# Patient Record
Sex: Female | Born: 1945
Health system: Southern US, Community
[De-identification: ages and names within clinical notes are randomized; demographics above are authoritative.]

## PROBLEM LIST (undated history)

## (undated) DIAGNOSIS — N1831 Chronic kidney disease, stage 3a: Secondary | ICD-10-CM

## (undated) DIAGNOSIS — T8859XA Other complications of anesthesia, initial encounter: Secondary | ICD-10-CM

## (undated) DIAGNOSIS — M545 Low back pain: Secondary | ICD-10-CM

## (undated) DIAGNOSIS — Z972 Presence of dental prosthetic device (complete) (partial): Secondary | ICD-10-CM

## (undated) DIAGNOSIS — R0600 Dyspnea, unspecified: Secondary | ICD-10-CM

## (undated) DIAGNOSIS — F03B0 Unspecified dementia, moderate, without behavioral disturbance, psychotic disturbance, mood disturbance, and anxiety: Secondary | ICD-10-CM

## (undated) DIAGNOSIS — E7800 Pure hypercholesterolemia, unspecified: Secondary | ICD-10-CM

## (undated) DIAGNOSIS — F17200 Nicotine dependence, unspecified, uncomplicated: Secondary | ICD-10-CM

## (undated) DIAGNOSIS — J449 Chronic obstructive pulmonary disease, unspecified: Secondary | ICD-10-CM

## (undated) DIAGNOSIS — E079 Disorder of thyroid, unspecified: Secondary | ICD-10-CM

## (undated) DIAGNOSIS — M1990 Unspecified osteoarthritis, unspecified site: Secondary | ICD-10-CM

## (undated) DIAGNOSIS — G2581 Restless legs syndrome: Secondary | ICD-10-CM

## (undated) HISTORY — DX: Nicotine dependence, unspecified, uncomplicated: F17.200

## (undated) HISTORY — DX: Low back pain: M54.5

## (undated) HISTORY — DX: Disorder of thyroid, unspecified: E07.9

## (undated) HISTORY — DX: Chronic obstructive pulmonary disease, unspecified: J44.9

## (undated) HISTORY — DX: Pure hypercholesterolemia, unspecified: E78.00

## (undated) HISTORY — PX: APPENDECTOMY: SHX54

---
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
## 1979-01-09 HISTORY — PX: OTHER SURGICAL HISTORY: SHX169

## 2004-02-21 ENCOUNTER — Emergency Department: Payer: Self-pay | Admitting: Unknown Physician Specialty

## 2004-02-21 ENCOUNTER — Other Ambulatory Visit: Payer: Self-pay

## 2004-10-04 ENCOUNTER — Emergency Department: Payer: Self-pay | Admitting: Emergency Medicine

## 2005-10-15 DIAGNOSIS — E079 Disorder of thyroid, unspecified: Secondary | ICD-10-CM

## 2005-10-15 DIAGNOSIS — M1710 Unilateral primary osteoarthritis, unspecified knee: Secondary | ICD-10-CM | POA: Insufficient documentation

## 2005-10-15 HISTORY — DX: Disorder of thyroid, unspecified: E07.9

## 2005-10-24 ENCOUNTER — Ambulatory Visit: Payer: Self-pay | Admitting: General Practice

## 2005-10-30 ENCOUNTER — Ambulatory Visit: Payer: Self-pay | Admitting: General Practice

## 2006-05-06 DIAGNOSIS — M545 Low back pain, unspecified: Secondary | ICD-10-CM

## 2006-05-06 HISTORY — DX: Low back pain, unspecified: M54.50

## 2006-10-30 ENCOUNTER — Ambulatory Visit: Payer: Self-pay | Admitting: General Practice

## 2007-11-05 ENCOUNTER — Ambulatory Visit: Payer: Self-pay | Admitting: General Practice

## 2008-02-27 ENCOUNTER — Ambulatory Visit: Payer: Self-pay | Admitting: Family Medicine

## 2009-01-27 ENCOUNTER — Ambulatory Visit: Payer: Self-pay | Admitting: Family Medicine

## 2009-02-24 DIAGNOSIS — J449 Chronic obstructive pulmonary disease, unspecified: Secondary | ICD-10-CM | POA: Insufficient documentation

## 2009-10-06 ENCOUNTER — Emergency Department: Payer: Self-pay | Admitting: Emergency Medicine

## 2009-10-11 ENCOUNTER — Ambulatory Visit: Payer: Self-pay | Admitting: Family Medicine

## 2012-10-08 HISTORY — PX: CATARACT EXTRACTION: SUR2

## 2014-01-28 DIAGNOSIS — F17200 Nicotine dependence, unspecified, uncomplicated: Secondary | ICD-10-CM | POA: Diagnosis not present

## 2014-01-28 DIAGNOSIS — M1990 Unspecified osteoarthritis, unspecified site: Secondary | ICD-10-CM | POA: Diagnosis not present

## 2014-01-28 DIAGNOSIS — J40 Bronchitis, not specified as acute or chronic: Secondary | ICD-10-CM | POA: Diagnosis not present

## 2014-02-24 ENCOUNTER — Other Ambulatory Visit (HOSPITAL_COMMUNITY): Payer: Self-pay | Admitting: Internal Medicine

## 2014-02-24 DIAGNOSIS — M810 Age-related osteoporosis without current pathological fracture: Secondary | ICD-10-CM

## 2014-02-24 DIAGNOSIS — J40 Bronchitis, not specified as acute or chronic: Secondary | ICD-10-CM | POA: Diagnosis not present

## 2014-02-24 DIAGNOSIS — M1990 Unspecified osteoarthritis, unspecified site: Secondary | ICD-10-CM | POA: Diagnosis not present

## 2014-02-24 DIAGNOSIS — Z0001 Encounter for general adult medical examination with abnormal findings: Secondary | ICD-10-CM | POA: Diagnosis not present

## 2014-02-24 DIAGNOSIS — R7309 Other abnormal glucose: Secondary | ICD-10-CM | POA: Diagnosis not present

## 2014-02-24 DIAGNOSIS — Z23 Encounter for immunization: Secondary | ICD-10-CM | POA: Diagnosis not present

## 2014-02-24 DIAGNOSIS — R42 Dizziness and giddiness: Secondary | ICD-10-CM | POA: Diagnosis not present

## 2014-02-24 DIAGNOSIS — R739 Hyperglycemia, unspecified: Secondary | ICD-10-CM | POA: Diagnosis not present

## 2014-03-01 ENCOUNTER — Ambulatory Visit (HOSPITAL_COMMUNITY)
Admission: RE | Admit: 2014-03-01 | Discharge: 2014-03-01 | Disposition: A | Discharge status: Home / Self Care | Payer: Medicare Other | Source: Ambulatory Visit | Attending: Internal Medicine | Admitting: Internal Medicine

## 2014-03-01 ENCOUNTER — Other Ambulatory Visit (HOSPITAL_COMMUNITY): Payer: Self-pay

## 2014-03-01 DIAGNOSIS — Z13820 Encounter for screening for osteoporosis: Secondary | ICD-10-CM | POA: Diagnosis not present

## 2014-03-01 DIAGNOSIS — M810 Age-related osteoporosis without current pathological fracture: Secondary | ICD-10-CM | POA: Diagnosis not present

## 2014-03-01 DIAGNOSIS — Z780 Asymptomatic menopausal state: Secondary | ICD-10-CM | POA: Insufficient documentation

## 2014-04-13 DIAGNOSIS — J40 Bronchitis, not specified as acute or chronic: Secondary | ICD-10-CM | POA: Diagnosis not present

## 2014-10-25 ENCOUNTER — Telehealth: Payer: Self-pay | Admitting: Family Medicine

## 2014-10-25 NOTE — Telephone Encounter (Addendum)
Called pt and pt states she does not need to come in right now/MW

## 2014-10-25 NOTE — Telephone Encounter (Signed)
Please advise 

## 2014-10-25 NOTE — Telephone Encounter (Signed)
Pt calling wanting to know if Simona Huh will take her back as a pt, she was last seen April 2015 here, pt has been seeing Dr. Emilia Beck in [place] but would like to come back here if possible. CC

## 2014-12-10 ENCOUNTER — Encounter: Payer: Self-pay | Admitting: Family Medicine

## 2014-12-10 ENCOUNTER — Ambulatory Visit (INDEPENDENT_AMBULATORY_CARE_PROVIDER_SITE_OTHER): Payer: Medicare Other | Admitting: Family Medicine

## 2014-12-10 VITALS — BP 124/70 | HR 73 | Temp 97.9°F | Resp 16 | Ht 64.0 in | Wt 139.0 lb

## 2014-12-10 DIAGNOSIS — J069 Acute upper respiratory infection, unspecified: Secondary | ICD-10-CM | POA: Diagnosis not present

## 2014-12-10 DIAGNOSIS — E7800 Pure hypercholesterolemia, unspecified: Secondary | ICD-10-CM | POA: Insufficient documentation

## 2014-12-10 MED ORDER — DOXYCYCLINE HYCLATE 100 MG PO CAPS: 100.0000 mg | ORAL_CAPSULE | Freq: Two times a day (BID) | ORAL | Status: DC

## 2014-12-10 NOTE — Progress Notes (Signed)
Subjective:     Patient ID: Katie Carter, female   DOB: 09-Jul-1945, 69 y.o.   MRN: [id number]  HPI  Chief Complaint  Patient presents with  . Cough    Patient comes in office today with concerns of cough and congestion for 1 week. Patient denies any cold like symptoms, she has taken otc Robitussin and Alka-seltzer  Reports greenish sputum but no shortness of breath. States her symptom were cold like at onset but sinus congestion and sore throat have resolved. States she has an albuterol inhaler she uses once a day. Current tobacco use is 0.5 ppd. Reports recent death of a daughter and her spouse is recovering from pneumonia. Accompanied by a daughter today.   Review of Systems  Constitutional: Negative for fever and chills.       Objective:   Physical Exam  Constitutional: She appears well-developed and well-nourished. No distress.  Ears: T.M's intact without inflammation Throat: no tonsillar enlargement or exudate Neck: no cervical adenopathy Lungs: clear, diminished breath sounds symmetrically     Assessment:    1. Upper respiratory infection - doxycycline (VIBRAMYCIN) 100 MG capsule; Take 1 capsule (100 mg total) by mouth 2 (two) times daily.  Dispense: 20 capsule; Refill: 0    Plan:    Discussed scheduling albuterol and use of expectorants while ill. If increased shortness of breath or sputum becoming thicker/purulent to start doxycycline.

## 2014-12-10 NOTE — Patient Instructions (Signed)
Continue expectorants like Robitussin or Mucinex. Increase Albuterol to twice daily while ill or more as needed. If your sputum does not clear up in the next few days or you feel more short of breath start the antibiotic.

## 2015-02-16 ENCOUNTER — Ambulatory Visit (INDEPENDENT_AMBULATORY_CARE_PROVIDER_SITE_OTHER): Payer: Medicare Other | Admitting: Physician Assistant

## 2015-02-16 ENCOUNTER — Encounter: Payer: Self-pay | Admitting: Physician Assistant

## 2015-02-16 VITALS — BP 124/72 | HR 72 | Temp 98.2°F | Resp 16 | Ht 64.0 in | Wt 138.0 lb

## 2015-02-16 DIAGNOSIS — Z0000 Encounter for general adult medical examination without abnormal findings: Secondary | ICD-10-CM

## 2015-02-16 NOTE — Patient Instructions (Signed)

## 2015-02-16 NOTE — Progress Notes (Signed)
Patient ID: Katie Carter, female   DOB: [birth year], 70 y.o.   MRN: [id number]        Patient: Katie Carter, Female    DOB: 26-Aug-1945, 70 y.o.   MRN: [id number] Visit Date: 02/16/2015  Today's Provider: Mar Daring, PA-C   Chief Complaint  Patient presents with  . Annual wellness exam   Subjective:    Annual wellness visit Katie Carter is a 70 y.o. female. She feels well. She reports exercising occasionally. She reports she is sleeping well. She states she is trying to improve her eating habits and adheres to a healthy diet. She is incorporating more fruits and vegetables. She does still smoke and does not wish to quit. She does not wish to have a Pap smear, colonoscopy, mammogram or labs at this time. There is a positive family history of breast cancer in her sister. She does states she performs self breast exams regularly. She states if she were to find something she would get a mammogram at that time. There is no family history of colon cancer. She is status post hysterectomy but states that ovaries remain.  Review of Systems  Constitutional: Negative.   HENT: Negative.   Eyes: Negative.   Respiratory: Negative.   Cardiovascular: Negative.   Gastrointestinal: Negative.   Endocrine: Negative.   Genitourinary: Negative.   Musculoskeletal: Negative.   Skin: Negative.   Allergic/Immunologic: Negative.   Neurological: Negative.   Hematological: Negative.   Psychiatric/Behavioral: Negative.     Social History   Social History  . Marital Status: Divorced    Spouse Name: N/A  . Number of Children: N/A  . Years of Education: N/A   Occupational History  . Not on file.   Social History Main Topics  . Smoking status: Current Every Day Smoker -- 0.50 packs/day    Types: Cigarettes  . Smokeless tobacco: Never Used  . Alcohol Use: 1.2 - 2.4 oz/week    2-4 Standard drinks or equivalent per week     Comment: Occasional  . Drug Use: No  . Sexual Activity: Not on file     Other Topics Concern  . Not on file   Social History Narrative    Past Medical History  Diagnosis Date  . Tobacco use disorder   . Chronic airway obstruction ([place])   . Lumbago 05/06/2006  . Thyroid disease 10/15/2005  . Hypercholesteremia      Patient Active Problem List   Diagnosis Date Noted  . Hypercholesteremia 12/10/2014  . CAFL (chronic airflow limitation) ([place]) 02/24/2009  . LBP (low back pain) 05/06/2006  . Acid reflux 10/15/2005  . Adult hypothyroidism 10/15/2005  . Primary localized osteoarthrosis, lower leg 10/15/2005  . Tobacco use 10/15/2005    Past Surgical History  Procedure Laterality Date  . Cataract extraction Right 10/2012  . Surgical fixation  1981    of the left knee fracture  . Abdominal hysterectomy  1960    abdominal Hysterectomy with bilateral salpingo-oophorectomy  . Appendectomy      Her family history is not on file.    Previous Medications   ALBUTEROL (PROVENTIL HFA;VENTOLIN HFA) 108 (90 BASE) MCG/ACT INHALER    Inhale into the lungs every 6 (six) hours as needed for wheezing or shortness of breath.   DOXYCYCLINE (VIBRAMYCIN) 100 MG CAPSULE    Take 1 capsule (100 mg total) by mouth 2 (two) times daily.   RANITIDINE (ZANTAC) 150 MG TABLET    Take 150 mg by mouth 2 (  two) times daily.    Patient Care Team: Margo Common, PA as PCP - General (Family Medicine)     Objective:   Vitals: BP 124/72 mmHg  Pulse 72  Temp(Src) 98.2 F (36.8 C)  Resp 16  Ht 5\' 4"  (1.626 m)  Wt 138 lb (62.596 kg)  BMI 23.68 kg/m2  Physical Exam  Constitutional: She is oriented to person, place, and time. She appears well-developed and well-nourished. No distress.  HENT:  Head: Normocephalic and atraumatic.  Right Ear: Hearing, tympanic membrane, external ear and ear canal normal.  Left Ear: Hearing, tympanic membrane, external ear and ear canal normal.  Nose: Nose normal.  Mouth/Throat: Uvula is midline, oropharynx is clear and moist and mucous  membranes are normal. No oropharyngeal exudate, posterior oropharyngeal edema or posterior oropharyngeal erythema.  Eyes: Conjunctivae and EOM are normal. Pupils are equal, round, and reactive to light. Right eye exhibits no discharge. Left eye exhibits no discharge. No scleral icterus.  Neck: Normal range of motion. Neck supple. No JVD present. No tracheal deviation present. No thyromegaly present.  Cardiovascular: Normal rate, regular rhythm, normal heart sounds and intact distal pulses.  Exam reveals no gallop and no friction rub.   No murmur heard. Pulmonary/Chest: Effort normal and breath sounds normal. No respiratory distress. She has no wheezes. She has no rales. She exhibits no tenderness. Right breast exhibits no inverted nipple, no mass, no nipple discharge, no skin change and no tenderness. Left breast exhibits no inverted nipple, no mass, no nipple discharge, no skin change and no tenderness. Breasts are symmetrical.  Abdominal: Soft. Bowel sounds are normal. She exhibits no distension and no mass. There is no tenderness. There is no rebound and no guarding.  Musculoskeletal: Normal range of motion. She exhibits no edema or tenderness.  Lymphadenopathy:    She has no cervical adenopathy.  Neurological: She is alert and oriented to person, place, and time.  Skin: Skin is warm and dry. No rash noted. She is not diaphoretic.  Psychiatric: She has a normal mood and affect. Her behavior is normal. Judgment and thought content normal.  Vitals reviewed.   Activities of Daily Living In your present state of health, do you have any difficulty performing the following activities: 02/16/2015  Hearing? N  Vision? N  Difficulty concentrating or making decisions? N  Walking or climbing stairs? N  Dressing or bathing? N  Doing errands, shopping? N    Fall Risk Assessment Fall Risk  02/16/2015  Falls in the past year? No     Depression Screen PHQ 2/9 Scores 02/16/2015  PHQ - 2 Score 0     Cognitive Testing - 6-CIT  Correct? Score   What year is it? yes 0 0 or 4  What month is it? yes 0 0 or 3  Memorize:    Katie Carter,  [birth year],  [place],      What time is it? (within 1 hour) yes 0 0 or 3  Count backwards from 20 yes 0 0, 2, or 4  Name the months of the year no 3 0, 2, or 4  Repeat name & address above no 4 0, 2, 4, 6, 8, or 10       TOTAL SCORE  7/28   Interpretation:  Normal  Normal (0-7) Abnormal (8-28)       Assessment & Plan:     Annual Wellness Visit  Reviewed patient's Family Medical History Reviewed and updated list of patient's  medical providers Assessment of cognitive impairment was done Assessed patient's functional ability Established a written schedule for health screening [place] Completed and Reviewed  Exercise Activities and Dietary recommendations Goals    None       There is no immunization history on file for this patient.  Health Maintenance  Topic Date Due  . Hepatitis C Screening  1945/07/31  . TETANUS/TDAP  01/25/1962  . MAMMOGRAM  01/25/1993  . COLONOSCOPY  01/25/1993  . ZOSTAVAX  01/26/2003  . PNA vac Low Risk Adult (1 of 2 - PCV13) 01/26/2008  . INFLUENZA VACCINE  08/09/2014  . DEXA SCAN  Completed      Discussed health benefits of physical activity, and encouraged her to engage in regular exercise appropriate for her age and condition.    ------------------------------------------------------------------------------------------------------------

## 2015-04-01 ENCOUNTER — Other Ambulatory Visit: Payer: Self-pay | Admitting: Family Medicine

## 2015-04-01 DIAGNOSIS — Z1231 Encounter for screening mammogram for malignant neoplasm of breast: Secondary | ICD-10-CM

## 2015-04-12 ENCOUNTER — Ambulatory Visit: Payer: Medicare Other | Attending: Family Medicine

## 2015-09-14 ENCOUNTER — Ambulatory Visit: Payer: Self-pay | Admitting: Physician Assistant

## 2015-10-12 DIAGNOSIS — Z23 Encounter for immunization: Secondary | ICD-10-CM | POA: Diagnosis not present

## 2015-11-16 ENCOUNTER — Ambulatory Visit: Payer: Self-pay

## 2015-11-21 DIAGNOSIS — Z23 Encounter for immunization: Secondary | ICD-10-CM | POA: Diagnosis not present

## 2016-02-23 ENCOUNTER — Ambulatory Visit (INDEPENDENT_AMBULATORY_CARE_PROVIDER_SITE_OTHER): Payer: Medicare Other | Admitting: Physician Assistant

## 2016-02-23 ENCOUNTER — Encounter: Payer: Self-pay | Admitting: Physician Assistant

## 2016-02-23 VITALS — BP 130/70 | HR 86 | Temp 97.7°F | Resp 16 | Ht 64.0 in | Wt 138.4 lb

## 2016-02-23 DIAGNOSIS — Z720 Tobacco use: Secondary | ICD-10-CM | POA: Diagnosis not present

## 2016-02-23 DIAGNOSIS — Z1159 Encounter for screening for other viral diseases: Secondary | ICD-10-CM | POA: Diagnosis not present

## 2016-02-23 DIAGNOSIS — Z1211 Encounter for screening for malignant neoplasm of colon: Secondary | ICD-10-CM | POA: Diagnosis not present

## 2016-02-23 DIAGNOSIS — Z1231 Encounter for screening mammogram for malignant neoplasm of breast: Secondary | ICD-10-CM | POA: Diagnosis not present

## 2016-02-23 DIAGNOSIS — E7800 Pure hypercholesterolemia, unspecified: Secondary | ICD-10-CM | POA: Diagnosis not present

## 2016-02-23 DIAGNOSIS — Z0000 Encounter for general adult medical examination without abnormal findings: Secondary | ICD-10-CM | POA: Diagnosis not present

## 2016-02-23 DIAGNOSIS — Z1239 Encounter for other screening for malignant neoplasm of breast: Secondary | ICD-10-CM

## 2016-02-23 NOTE — Patient Instructions (Signed)

## 2016-02-23 NOTE — Progress Notes (Signed)
Patient: Katie Carter, Female    DOB: January 13, 1945, 71 y.o.   MRN: [id number] Visit Date: 02/23/2016  Today's Provider: Mar Daring, PA-C   Chief Complaint  Patient presents with  . Medicare Wellness   Subjective:    Annual wellness visit Katie Carter is a 71 y.o. female. She feels well. She reports exercising none. She reports she is sleeping well. Per patient she had her pneumonia and influenza vaccine 3 months ago at [facility]. Patient's older brother passed away two days ago from cancer. Patient reports she has had mammograms and colonoscopies in the past. Cannot remember the dates. They have always been normal.  -----------------------------------------------------------   Review of Systems  Constitutional: Positive for diaphoresis.  HENT: Positive for rhinorrhea and sneezing.   Eyes: Negative.   Respiratory: Positive for cough.   Cardiovascular: Negative.   Gastrointestinal: Negative.   Endocrine: Negative.   Genitourinary: Negative.   Musculoskeletal: Negative.   Skin: Negative.   Allergic/Immunologic: Negative.   Neurological: Negative.   Hematological: Negative.   Psychiatric/Behavioral: Negative.     Social History   Social History  . Marital status: Divorced    Spouse name: N/A  . Number of children: N/A  . Years of education: N/A   Occupational History  . Not on file.   Social History Main Topics  . Smoking status: Current Every Day Smoker    Packs/day: 0.50    Types: Cigarettes  . Smokeless tobacco: Never Used  . Alcohol use 1.2 - 2.4 oz/week    2 - 4 Standard drinks or equivalent per week     Comment: Occasional  . Drug use: No  . Sexual activity: Not on file   Other Topics Concern  . Not on file   Social History Narrative  . No narrative on file    Past Medical History:  Diagnosis Date  . Chronic airway obstruction ([place])   . Hypercholesteremia   . Lumbago 05/06/2006  . Thyroid disease 10/15/2005  . Tobacco use  disorder      Patient Active Problem List   Diagnosis Date Noted  . Hypercholesteremia 12/10/2014  . CAFL (chronic airflow limitation) ([place]) 02/24/2009  . LBP (low back pain) 05/06/2006  . Acid reflux 10/15/2005  . Adult hypothyroidism 10/15/2005  . Primary localized osteoarthrosis, lower leg 10/15/2005  . Tobacco use 10/15/2005    Past Surgical History:  Procedure Laterality Date  . ABDOMINAL HYSTERECTOMY  1960   abdominal Hysterectomy with bilateral salpingo-oophorectomy  . APPENDECTOMY    . CATARACT EXTRACTION Right 10/2012  . Surgical Fixation  1981   of the left knee fracture    Her family history is not on file.      Current Outpatient Prescriptions:  .  albuterol (PROVENTIL HFA;VENTOLIN HFA) 108 (90 BASE) MCG/ACT inhaler, Inhale into the lungs every 6 (six) hours as needed for wheezing or shortness of breath., Disp: , Rfl:  .  ranitidine (ZANTAC) 150 MG tablet, Take 150 mg by mouth 2 (two) times daily., Disp: , Rfl:   Patient Care Team: Margo Common, PA as PCP - General (Family Medicine)     Objective:   Vitals: BP 130/70 (BP Location: Right Arm, Patient Position: Sitting, Cuff Size: Normal)   Pulse 86   Temp 97.7 F (36.5 C) (Oral)   Resp 16   Ht 5\' 4"  (1.626 m)   Wt 138 lb 6.4 oz (62.8 kg)   BMI 23.76 kg/m   Physical  Exam  Constitutional: She is oriented to person, place, and time. She appears well-developed and well-nourished. No distress.  HENT:  Head: Normocephalic and atraumatic.  Right Ear: Hearing, tympanic membrane, external ear and ear canal normal.  Left Ear: Hearing, tympanic membrane, external ear and ear canal normal.  Nose: Nose normal.  Mouth/Throat: Uvula is midline, oropharynx is clear and moist and mucous membranes are normal. She has dentures. No oropharyngeal exudate.  Eyes: Conjunctivae and EOM are normal. Pupils are equal, round, and reactive to light. Right eye exhibits no discharge. Left eye exhibits no discharge. No scleral  icterus.  Neck: Normal range of motion. Neck supple. No JVD present. Carotid bruit is not present. No tracheal deviation present. No thyromegaly present.  Cardiovascular: Normal rate, regular rhythm, normal heart sounds and intact distal pulses.  Exam reveals no gallop and no friction rub.   No murmur heard. Pulmonary/Chest: Effort normal and breath sounds normal. No respiratory distress. She has no wheezes. She has no rales. She exhibits no tenderness.  Abdominal: Soft. Bowel sounds are normal. She exhibits no distension and no mass. There is no tenderness. There is no rebound and no guarding.  Musculoskeletal: Normal range of motion. She exhibits no edema or tenderness.  Lymphadenopathy:    She has no cervical adenopathy.  Neurological: She is alert and oriented to person, place, and time.  Skin: Skin is warm and dry. No rash noted. She is not diaphoretic.  Psychiatric: She has a normal mood and affect. Her behavior is normal. Judgment and thought content normal.  Vitals reviewed.   Activities of Daily Living In your present state of health, do you have any difficulty performing the following activities: 02/23/2016  Hearing? N  Vision? N  Difficulty concentrating or making decisions? N  Walking or climbing stairs? N  Dressing or bathing? N  Doing errands, shopping? N  Some recent data might be hidden    Fall Risk Assessment Fall Risk  02/23/2016 02/16/2015  Falls in the past year? No No     Depression Screen PHQ 2/9 Scores 02/23/2016 02/16/2015  PHQ - 2 Score 0 0    Cognitive Testing - 6-CIT  Correct? Score   What year is it? yes 0 0 or 4  What month is it? yes 0 0 or 3  Memorize:    [name],  40,  [place] [address]  [place],      What time is it? (within 1 hour) yes 0 0 or 3  Count backwards from 20 yes 0 0, 2, or 4  Name the months of the year yes 0 0, 2, or 4  Repeat name & address above yes 0 0, 2, 4, 6, 8, or 10       TOTAL SCORE  0/28   Interpretation:  Normal  Normal  (0-7) Abnormal (8-28)   Audit-C Alcohol Use Screening  Question Answer Points  How often do you have alcoholic drink? never 0  On days you do drink alcohol, how many drinks do you typically consume? 0 0  How oftey will you drink 6 or more in a total? never 0  Total Score:  0   A score of 3 or more in women, and 4 or more in men indicates increased risk for alcohol abuse, EXCEPT if all of the points are from question 1.      Assessment & Plan:     Annual Wellness Visit  Reviewed patient's Family Medical History Reviewed and updated list of  patient's medical providers Assessment of cognitive impairment was done Assessed patient's functional ability Established a written schedule for health screening [place] Completed and Reviewed  Exercise Activities and Dietary recommendations Goals    None       There is no immunization history on file for this patient.  Health Maintenance  Topic Date Due  . Hepatitis C Screening  [birth year]  . TETANUS/TDAP  01/25/1962  . MAMMOGRAM  01/25/1993  . COLONOSCOPY  01/25/1993  . ZOSTAVAX  01/26/2003  . PNA vac Low Risk Adult (1 of 2 - PCV13) 01/26/2008  . INFLUENZA VACCINE  08/09/2015  . DEXA SCAN  Completed     Discussed health benefits of physical activity, and encouraged her to engage in regular exercise appropriate for her age and condition.   1. Medicare annual wellness visit, subsequent Normal exam today.   2. Hypercholesteremia H/O this. Patient has refused cholesterol lowering medications. Will check labs as below and f/u pending results. - CBC w/Diff/Platelet - Comprehensive Metabolic Panel (CMET) - Lipid Profile  3. Breast cancer screening Patient refuses mammogram this year. States she does her own breast exams. No family history of breast cancer.   4. Colon cancer screening Patient refuses. Patient was also offered cologuard but she refused as well.   5. Tobacco use Compulsive smoker.  Will check labs as below and f/u pending results. - CBC w/Diff/Platelet - Comprehensive Metabolic Panel (CMET)  6. Need for hepatitis C screening test - Hepatitis C Antibody  ------------------------------------------------------------------------------------------------------------    Mar Daring, PA-C  [facility]

## 2016-02-24 ENCOUNTER — Telehealth: Payer: Self-pay

## 2016-02-24 LAB — COMPREHENSIVE METABOLIC PANEL
A/G RATIO: 1.6 (ref 1.2–2.2)
ALK PHOS: 72 IU/L (ref 39–117)
ALT: 12 IU/L (ref 0–32)
AST: 15 IU/L (ref 0–40)
Albumin: 4.5 g/dL (ref 3.5–4.8)
BILIRUBIN TOTAL: 0.3 mg/dL (ref 0.0–1.2)
BUN / CREAT RATIO: 15 (ref 12–28)
BUN: 14 mg/dL (ref 8–27)
CHLORIDE: 99 mmol/L (ref 96–106)
CO2: 24 mmol/L (ref 18–29)
Calcium: 10 mg/dL (ref 8.7–10.3)
Creatinine, Ser: 0.91 mg/dL (ref 0.57–1.00)
GFR calc Af Amer: 72 mL/min/{1.73_m2} (ref >59)
GFR calc non Af Amer: 63 mL/min/{1.73_m2} (ref >59)
GLUCOSE: 72 mg/dL (ref 65–99)
Globulin, Total: 2.9 g/dL (ref 1.5–4.5)
POTASSIUM: 4.4 mmol/L (ref 3.5–5.2)
Sodium: 139 mmol/L (ref 134–144)
Total Protein: 7.4 g/dL (ref 6.0–8.5)

## 2016-02-24 LAB — CBC WITH DIFFERENTIAL/PLATELET
BASOS ABS: 0.1 10*3/uL (ref 0.0–0.2)
Basos: 1 %
EOS (ABSOLUTE): 0.2 10*3/uL (ref 0.0–0.4)
Eos: 2 %
Hematocrit: 47.5 % — ABNORMAL HIGH (ref 34.0–46.6)
Hemoglobin: 16.3 g/dL — ABNORMAL HIGH (ref 11.1–15.9)
Immature Grans (Abs): 0 10*3/uL (ref 0.0–0.1)
Immature Granulocytes: 0 %
LYMPHS ABS: 4.2 10*3/uL — AB (ref 0.7–3.1)
Lymphs: 30 %
MCH: 30.1 pg (ref 26.6–33.0)
MCHC: 34.3 g/dL (ref 31.5–35.7)
MCV: 88 fL (ref 79–97)
MONOS ABS: 1.2 10*3/uL — AB (ref 0.1–0.9)
Monocytes: 8 %
Neutrophils Absolute: 8.4 10*3/uL — ABNORMAL HIGH (ref 1.4–7.0)
Neutrophils: 59 %
PLATELETS: 260 10*3/uL (ref 150–379)
RBC: 5.41 x10E6/uL — ABNORMAL HIGH (ref 3.77–5.28)
RDW: 14.3 % (ref 12.3–15.4)
WBC: 14.2 10*3/uL — ABNORMAL HIGH (ref 3.4–10.8)

## 2016-02-24 LAB — LIPID PANEL
CHOLESTEROL TOTAL: 227 mg/dL — AB (ref 100–199)
Chol/HDL Ratio: 5 ratio units — ABNORMAL HIGH (ref 0.0–4.4)
HDL: 45 mg/dL (ref >39)
LDL Calculated: 138 mg/dL — ABNORMAL HIGH (ref 0–99)
Triglycerides: 220 mg/dL — ABNORMAL HIGH (ref 0–149)
VLDL CHOLESTEROL CAL: 44 mg/dL — AB (ref 5–40)

## 2016-02-24 NOTE — Telephone Encounter (Signed)
Pt advised-aa 

## 2016-02-24 NOTE — Telephone Encounter (Signed)
-----   Message from Mar Daring, [place] sent at 02/24/2016 12:41 PM EST ----- Slight elevated WBC and elevated HgB that could be secondary to smoking. Would recommend a recheck to see if any changes in 4-6 weeks. Cholesterol is slightly up as well. Work on trying to eat healthier, limiting fatty foods, sugars and red meats. All other labs were normal.

## 2016-02-24 NOTE — Telephone Encounter (Signed)
[id number] 

## 2016-08-15 DIAGNOSIS — Z1231 Encounter for screening mammogram for malignant neoplasm of breast: Secondary | ICD-10-CM | POA: Diagnosis not present

## 2016-08-15 LAB — HM MAMMOGRAPHY

## 2016-08-23 ENCOUNTER — Encounter: Payer: Self-pay | Admitting: Family Medicine

## 2016-10-03 LAB — FECAL OCCULT BLOOD, GUAIAC: Fecal Occult Blood: NEGATIVE

## 2017-01-24 ENCOUNTER — Ambulatory Visit: Payer: Self-pay | Admitting: Family Medicine

## 2017-01-24 ENCOUNTER — Encounter: Payer: Self-pay | Admitting: Physician Assistant

## 2017-01-24 ENCOUNTER — Ambulatory Visit (INDEPENDENT_AMBULATORY_CARE_PROVIDER_SITE_OTHER): Payer: Medicare Other | Admitting: Physician Assistant

## 2017-01-24 VITALS — BP 130/68 | HR 63 | Temp 97.8°F | Resp 16 | Wt 136.0 lb

## 2017-01-24 DIAGNOSIS — R0981 Nasal congestion: Secondary | ICD-10-CM | POA: Diagnosis not present

## 2017-01-24 MED ORDER — FEXOFENADINE HCL 180 MG PO TABS: 180.0000 mg | ORAL_TABLET | Freq: Every day | ORAL | 0 refills | Status: DC

## 2017-01-24 MED ORDER — FLUTICASONE PROPIONATE 50 MCG/ACT NA SUSP: 2.0000 | Freq: Every day | NASAL | 6 refills | Status: DC

## 2017-01-24 NOTE — Progress Notes (Signed)
Patient: Katie Carter Female    DOB: [birth year]   72 y.o.   MRN: [id number] Visit Date: 01/24/2017  Today's Provider: Trinna Post, PA-C   Chief Complaint  Patient presents with  . Sinusitis  . URI   Subjective:    Katie Carter is a 72 y/o woman with 50 pack year smoking history presenting today with four months of nasal/sinus congestion and nasal drainage. She presents here today with her daughter who is concerned for a sinus infection that may turn into pneumonia.  She says she has had 4 months of intermittent nasal congestion and rhinorrhea. She does have a cough that is minimally productive. No fevers, chills, N/V, confusion. Denies wheezing and SOB.   Sinusitis  This is a recurrent problem. The current episode started more than 1 month ago. The problem has been waxing and waning since onset. There has been no fever. Associated symptoms include coughing, headaches and a sore throat. Pertinent negatives include no congestion, ear pain, neck pain, shortness of breath, sinus pressure, sneezing or swollen glands.  URI   This is a recurrent problem. The current episode started more than 1 month ago. The problem has been waxing and waning. There has been no fever. Associated symptoms include coughing, headaches, rhinorrhea, sinus pain and a sore throat. Pertinent negatives include no abdominal pain, chest pain, congestion, dysuria, ear pain, nausea, neck pain, plugged ear sensation, rash, sneezing, swollen glands, vomiting or wheezing. She has tried inhaler use for the symptoms. The treatment provided moderate relief.       Allergies  Allergen Reactions  . Tramadol Rash    no further information given.     Current Outpatient Medications:  .  albuterol (PROVENTIL HFA;VENTOLIN HFA) 108 (90 BASE) MCG/ACT inhaler, Inhale into the lungs every 6 (six) hours as needed for wheezing or shortness of breath., Disp: , Rfl:  .  ranitidine (ZANTAC) 150 MG tablet, Take 150 mg by mouth  2 (two) times daily., Disp: , Rfl:   Review of Systems  Constitutional: Negative.   HENT: Positive for postnasal drip, rhinorrhea, sinus pain and sore throat. Negative for congestion, ear discharge, ear pain, nosebleeds, sinus pressure, sneezing, trouble swallowing and voice change.   Eyes: Positive for discharge. Negative for photophobia, pain, redness, itching and visual disturbance.  Respiratory: Positive for cough. Negative for apnea, choking, chest tightness, shortness of breath, wheezing and stridor.   Cardiovascular: Negative for chest pain.  Gastrointestinal: Negative.  Negative for abdominal pain, nausea and vomiting.  Genitourinary: Negative for dysuria.  Musculoskeletal: Negative for neck pain.  Skin: Negative for rash.  Neurological: Positive for headaches. Negative for dizziness and light-headedness.    Social History   Tobacco Use  . Smoking status: Current Every Day Smoker    Packs/day: 0.50    Types: Cigarettes  . Smokeless tobacco: Never Used  Substance Use Topics  . Alcohol use: Yes    Alcohol/week: 1.2 - 2.4 oz    Types: 2 - 4 Standard drinks or equivalent per week    Comment: Occasional   Objective:   There were no vitals taken for this visit. There were no vitals filed for this visit.   Physical Exam  Constitutional: She is oriented to person, place, and time. She appears well-developed and well-nourished.  HENT:  Right Ear: External ear normal.  Left Ear: External ear normal.  Mouth/Throat: Oropharynx is clear and moist. No oropharyngeal exudate.  Eyes: Conjunctivae are normal.  Neck:  Neck supple.  Cardiovascular: Normal rate and regular rhythm.  Pulmonary/Chest: Effort normal. She has wheezes.  Some mild expiratory wheezing.   Lymphadenopathy:    She has no cervical adenopathy.  Neurological: She is alert and oriented to person, place, and time.  Skin: Skin is warm and dry.  Psychiatric: She has a normal mood and affect. Her behavior is normal.          Assessment & Plan:     1. Nasal congestion  Symptoms consistent with allergies vs infection or pneumonia. Patient does have significant smoking history and I suspect she has COPD, though I don't see spirometry in the chart, though her symptoms are not consistent with COPD exacerbation. Advised to quit smoking.  - fexofenadine (ALLEGRA ALLERGY) 180 MG tablet; Take 1 tablet (180 mg total) by mouth daily.  Dispense: 90 tablet; Refill: 0 - fluticasone (FLONASE) 50 MCG/ACT nasal spray; Place 2 sprays into both nostrils daily.  Dispense: 16 g; Refill: 6  2. Sinus congestion  - fexofenadine (ALLEGRA ALLERGY) 180 MG tablet; Take 1 tablet (180 mg total) by mouth daily.  Dispense: 90 tablet; Refill: 0  No Follow-up on file.  The entirety of the information documented in the History of Present Illness, Review of Systems and Physical Exam were personally obtained by me. Portions of this information were initially documented by Ashley Royalty, CMA and reviewed by me for thoroughness and accuracy.         Trinna Post, PA-C  [facility]

## 2017-01-24 NOTE — Patient Instructions (Addendum)
Allegra And flonase   Nasal Allergies Nasal allergies are a reaction to allergens in the air. Allergens are tiny specks (particles) in the air that cause your body to have an allergic reaction. Nasal allergies are not passed from person to person (contagious). They cannot be cured, but they can be controlled. Common causes of nasal allergies include:  Pollen from grasses, trees, and weeds.  House dust mites.  Pet dander.  Mold.  Follow these instructions at home:  Avoid the allergen that is causing your symptoms, if you can.  Keep windows closed. If possible, use air conditioning when there is a lot of pollen in the air.  Do not use fans in your home.  Do not hang clothes outside to dry.  Wear sunglasses to keep pollen out of your eyes.  Wash your hands right away after you touch household pets.  Take over-the-counter and prescription medicines only as told by your doctor.  Keep all follow-up visits as told by your doctor. This is important. Contact a doctor if:  You have a fever.  You have a cough that does not go away (is persistent).  You start to make whistling sounds when you breathe (wheeze).  Your symptoms do not get better with treatment.  You have thick fluid coming from your nose.  You start to have nosebleeds. Get help right away if:  Your tongue or your lips are swollen.  You have trouble breathing.  You feel light-headed or you feel like you are going to pass out (faint).  You have cold sweats. This information is not intended to replace advice given to you by your health care provider. Make sure you discuss any questions you have with your health care provider. Document Released: 04/26/2010 Document Revised: 06/02/2015 Document Reviewed: 07/07/2014 Elsevier Interactive Patient Education  [employer].

## 2017-03-11 ENCOUNTER — Encounter: Payer: Self-pay | Admitting: Family Medicine

## 2017-03-12 ENCOUNTER — Ambulatory Visit (INDEPENDENT_AMBULATORY_CARE_PROVIDER_SITE_OTHER): Payer: Medicare Other | Admitting: Family Medicine

## 2017-03-12 ENCOUNTER — Encounter: Payer: Self-pay | Admitting: Family Medicine

## 2017-03-12 ENCOUNTER — Ambulatory Visit (INDEPENDENT_AMBULATORY_CARE_PROVIDER_SITE_OTHER): Payer: Medicare Other

## 2017-03-12 VITALS — BP 132/72 | HR 76 | Temp 98.2°F | Ht 64.0 in | Wt 139.0 lb

## 2017-03-12 VITALS — BP 132/72 | HR 72 | Temp 98.2°F | Ht 64.0 in | Wt 139.0 lb

## 2017-03-12 DIAGNOSIS — Z1159 Encounter for screening for other viral diseases: Secondary | ICD-10-CM

## 2017-03-12 DIAGNOSIS — Z720 Tobacco use: Secondary | ICD-10-CM | POA: Diagnosis not present

## 2017-03-12 DIAGNOSIS — Z0000 Encounter for general adult medical examination without abnormal findings: Secondary | ICD-10-CM | POA: Diagnosis not present

## 2017-03-12 DIAGNOSIS — E7800 Pure hypercholesterolemia, unspecified: Secondary | ICD-10-CM | POA: Diagnosis not present

## 2017-03-12 NOTE — Patient Instructions (Signed)
Katie Carter , Thank you for taking time to come for your Medicare Wellness Visit. I appreciate your ongoing commitment to your health goals. Please review the following plan we discussed and let me know if I can assist you in the future.   Screening recommendations/referrals: Colonoscopy: FOBT up to date Mammogram: Up to date Bone Density: Up to date Recommended yearly ophthalmology/optometry visit for glaucoma screening and checkup Recommended yearly dental visit for hygiene and checkup  Vaccinations: Influenza vaccine: Up to date Pneumococcal vaccine: Up to date Tdap vaccine: Pt declines today.  Shingles vaccine: Pt declines today.     Advanced directives: Advance directive discussed with you today. Even though you declined this today please call our office should you change your mind and we can give you the proper paperwork for you to fill out.  Conditions/risks identified: Recommend to start exercising 3 days a week for at least minutes a time. Pt to start exercising when the weather warms up.   Next appointment: 2:40 PM today with [name].   Preventive Care 74 Years and Older, Female Preventive care refers to lifestyle choices and visits with your health care provider that can promote health and wellness. What does preventive care include?  A yearly physical exam. This is also called an annual well check.  Dental exams once or twice a year.  Routine eye exams. Ask your health care provider how often you should have your eyes checked.  Personal lifestyle choices, including:  Daily care of your teeth and gums.  Regular physical activity.  Eating a healthy diet.  Avoiding tobacco and drug use.  Limiting alcohol use.  Practicing safe sex.  Taking low-dose aspirin every day.  Taking vitamin and mineral supplements as recommended by your health care provider. What happens during an annual well check? The services and screenings done by your health care  provider during your annual well check will depend on your age, overall health, lifestyle risk factors, and family history of disease. Counseling  Your health care provider may ask you questions about your:  Alcohol use.  Tobacco use.  Drug use.  Emotional well-being.  Home and relationship well-being.  Sexual activity.  Eating habits.  History of falls.  Memory and ability to understand (cognition).  Work and work Statistician.  Reproductive health. Screening  You may have the following tests or measurements:  Height, weight, and BMI.  Blood pressure.  Lipid and cholesterol levels. These may be checked every 5 years, or more frequently if you are over 42 years old.  Skin check.  Lung cancer screening. You may have this screening every year starting at age 74 if you have a 30-pack-year history of smoking and currently smoke or have quit within the past 15 years.  Fecal occult blood test (FOBT) of the stool. You may have this test every year starting at age 74.  Flexible sigmoidoscopy or colonoscopy. You may have a sigmoidoscopy every 5 years or a colonoscopy every 10 years starting at age 74.  Hepatitis C blood test.  Hepatitis B blood test.  Sexually transmitted disease (STD) testing.  Diabetes screening. This is done by checking your blood sugar (glucose) after you have not eaten for a while (fasting). You may have this done every 1-3 years.  Bone density scan. This is done to screen for osteoporosis. You may have this done starting at age 74.  Mammogram. This may be done every 1-2 years. Talk to your health care provider about how often you should  have regular mammograms. Talk with your health care provider about your test results, treatment options, and if necessary, the need for more tests. Vaccines  Your health care provider may recommend certain vaccines, such as:  Influenza vaccine. This is recommended every year.  Tetanus, diphtheria, and acellular  pertussis (Tdap, Td) vaccine. You may need a Td booster every 10 years.  Zoster vaccine. You may need this after age 74.  Pneumococcal 13-valent conjugate (PCV13) vaccine. One dose is recommended after age 74.  Pneumococcal polysaccharide (PPSV23) vaccine. One dose is recommended after age 74. Talk to your health care provider about which screenings and vaccines you need and how often you need them. This information is not intended to replace advice given to you by your health care provider. Make sure you discuss any questions you have with your health care provider. Document Released: 01/21/2015 Document Revised: 09/14/2015 Document Reviewed: 10/26/2014 Elsevier Interactive Patient Education  2017 [place] Prevention in the Home Falls can cause injuries. They can happen to people of all ages. There are many things you can do to make your home safe and to help prevent falls. What can I do on the outside of my home?  Regularly fix the edges of walkways and driveways and fix any cracks.  Remove anything that might make you trip as you walk through a door, such as a raised step or threshold.  Trim any bushes or trees on the path to your home.  Use bright outdoor lighting.  Clear any walking paths of anything that might make someone trip, such as rocks or tools.  Regularly check to see if handrails are loose or broken. Make sure that both sides of any steps have handrails.  Any raised decks and porches should have guardrails on the edges.  Have any leaves, snow, or ice cleared regularly.  Use sand or salt on walking paths during winter.  Clean up any spills in your garage right away. This includes oil or grease spills. What can I do in the bathroom?  Use night lights.  Install grab bars by the toilet and in the tub and shower. Do not use towel bars as grab bars.  Use non-skid mats or decals in the tub or shower.  If you need to sit down in the shower, use a plastic,  non-slip stool.  Keep the floor dry. Clean up any water that spills on the floor as soon as it happens.  Remove soap buildup in the tub or shower regularly.  Attach bath mats securely with double-sided non-slip rug tape.  Do not have throw rugs and other things on the floor that can make you trip. What can I do in the bedroom?  Use night lights.  Make sure that you have a light by your bed that is easy to reach.  Do not use any sheets or blankets that are too big for your bed. They should not hang down onto the floor.  Have a firm chair that has side arms. You can use this for support while you get dressed.  Do not have throw rugs and other things on the floor that can make you trip. What can I do in the kitchen?  Clean up any spills right away.  Avoid walking on wet floors.  Keep items that you use a lot in easy-to-reach places.  If you need to reach something above you, use a strong step stool that has a grab bar.  Keep electrical cords out of  the way.  Do not use floor polish or wax that makes floors slippery. If you must use wax, use non-skid floor wax.  Do not have throw rugs and other things on the floor that can make you trip. What can I do with my stairs?  Do not leave any items on the stairs.  Make sure that there are handrails on both sides of the stairs and use them. Fix handrails that are broken or loose. Make sure that handrails are as long as the stairways.  Check any carpeting to make sure that it is firmly attached to the stairs. Fix any carpet that is loose or worn.  Avoid having throw rugs at the top or bottom of the stairs. If you do have throw rugs, attach them to the floor with carpet tape.  Make sure that you have a light switch at the top of the stairs and the bottom of the stairs. If you do not have them, ask someone to add them for you. What else can I do to help prevent falls?  Wear shoes that:  Do not have high heels.  Have rubber  bottoms.  Are comfortable and fit you well.  Are closed at the toe. Do not wear sandals.  If you use a stepladder:  Make sure that it is fully opened. Do not climb a closed stepladder.  Make sure that both sides of the stepladder are locked into place.  Ask someone to hold it for you, if possible.  Clearly mark and make sure that you can see:  Any grab bars or handrails.  First and last steps.  Where the edge of each step is.  Use tools that help you move around (mobility aids) if they are needed. These include:  Canes.  Walkers.  Scooters.  Crutches.  Turn on the lights when you go into a dark area. Replace any light bulbs as soon as they burn out.  Set up your furniture so you have a clear path. Avoid moving your furniture around.  If any of your floors are uneven, fix them.  If there are any pets around you, be aware of where they are.  Review your medicines with your doctor. Some medicines can make you feel dizzy. This can increase your chance of falling. Ask your doctor what other things that you can do to help prevent falls. This information is not intended to replace advice given to you by your health care provider. Make sure you discuss any questions you have with your health care provider. Document Released: 10/21/2008 Document Revised: 06/02/2015 Document Reviewed: 01/29/2014 Elsevier Interactive Patient Education  2017 [employer].

## 2017-03-12 NOTE — Progress Notes (Addendum)
Subjective:   Katie Carter is a 72 y.o. female who presents for Medicare Annual (Subsequent) preventive examination.  Review of Systems:  N/A  Cardiac Risk Factors include: advanced age (>66men, >18 women);smoking/ tobacco exposure;dyslipidemia     Objective:     Vitals: BP 132/72 (BP Location: Left Arm)   Pulse 76   Temp 98.2 F (36.8 C) (Oral)   Ht 5\' 4"  (1.626 m)   Wt 139 lb (63 kg)   BMI 23.86 kg/m   Body mass index is 23.86 kg/m.  Advanced Directives 03/12/2017 02/23/2016  Does Patient Have a Medical Advance Directive? No No  Would patient like information on creating a medical advance directive? No - Patient declined -    Tobacco Social History   Tobacco Use  Smoking Status Current Every Day Smoker  . Packs/day: 0.50  . Types: Cigarettes  Smokeless Tobacco Never Used     Ready to quit: No Counseling given: No   Clinical Intake:  Pre-visit preparation completed: Yes  Pain : No/denies pain Pain Score: 0-No pain     Nutritional Status: BMI of 19-24  Normal Nutritional Risks: None Diabetes: No  How often do you need to have someone help you when you read instructions, pamphlets, or other written materials from your doctor or pharmacy?: 1 - Never  Interpreter Needed?: No  Information entered by :: [facility], LPN  Past Medical History:  Diagnosis Date  . Chronic airway obstruction ([place])   . Hypercholesteremia   . Lumbago 05/06/2006  . Thyroid disease 10/15/2005  . Tobacco use disorder    Past Surgical History:  Procedure Laterality Date  . ABDOMINAL HYSTERECTOMY  1960   abdominal Hysterectomy with bilateral salpingo-oophorectomy  . APPENDECTOMY    . CATARACT EXTRACTION Right 10/2012  . Surgical Fixation  1981   of the left knee fracture   Family History  Problem Relation Age of Onset  . Alzheimer's disease Mother   . Cancer Brother    Social History   Socioeconomic History  . Marital status: Divorced    Spouse name: None  .  Number of children: 2  . Years of education: None  . Highest education level: 9th grade  Social Needs  . Financial resource strain: Not hard at all  . Food insecurity - worry: Never true  . Food insecurity - inability: Never true  . Transportation needs - medical: No  . Transportation needs - non-medical: No  Occupational History  . Occupation: retired  Tobacco Use  . Smoking status: Current Every Day Smoker    Packs/day: 0.50    Types: Cigarettes  . Smokeless tobacco: Never Used  Substance and Sexual Activity  . Alcohol use: Yes    Comment: Occasionally a couple beers/monthly  . Drug use: No  . Sexual activity: None  Other Topics Concern  . None  Social History Narrative   1 daughter living and has 1 deceased.    Outpatient Encounter Medications as of 03/12/2017  Medication Sig  . calcium-vitamin D (OSCAL WITH D) 500-200 MG-UNIT tablet Take 1 tablet by mouth daily with breakfast.  . Omega-3 Fatty Acids (FISH OIL) 1000 MG CAPS Take by mouth daily.  Marland Kitchen albuterol (PROVENTIL HFA;VENTOLIN HFA) 108 (90 BASE) MCG/ACT inhaler Inhale into the lungs every 6 (six) hours as needed for wheezing or shortness of breath.  . fexofenadine (ALLEGRA ALLERGY) 180 MG tablet Take 1 tablet (180 mg total) by mouth daily. (Patient not taking: Reported on 03/12/2017)  . fluticasone (FLONASE) 50  MCG/ACT nasal spray Place 2 sprays into both nostrils daily. (Patient not taking: Reported on 03/12/2017)  . ranitidine (ZANTAC) 150 MG tablet Take 150 mg by mouth 2 (two) times daily.   No facility-administered encounter medications on file as of 03/12/2017.     Activities of Daily Living In your present state of health, do you have any difficulty performing the following activities: 03/12/2017  Hearing? N  Vision? N  Difficulty concentrating or making decisions? N  Walking or climbing stairs? N  Dressing or bathing? N  Doing errands, shopping? N  Preparing Food and eating ? N  Using the Toilet? N  In the past  six months, have you accidently leaked urine? N  Do you have problems with loss of bowel control? N  Managing your Medications? N  Managing your Finances? N  Housekeeping or managing your Housekeeping? N  Some recent data might be hidden    Patient Care Team: Chrismon, Vickki Muff, PA as PCP - General (Family Medicine)    Assessment:   This is a routine wellness examination for Katie Carter.  Exercise Activities and Dietary recommendations Current Exercise Habits: The patient does not participate in regular exercise at present, Exercise limited by: None identified  Goals    . Exercise 3x per week (30 min per time)     Recommend to start exercising 3 days a week for at least minutes a time. Pt to start exercising when the weather warms up.        Fall Risk Fall Risk  03/12/2017 02/23/2016 02/16/2015  Falls in the past year? No No No   Is the patient's home free of loose throw rugs in walkways, pet beds, electrical cords, etc?   yes      Grab bars in the bathroom? no      Handrails on the stairs?   yes      Adequate lighting?   yes  Timed Get Up and Go performed: N/A  Depression Screen PHQ 2/9 Scores 03/12/2017 03/12/2017 02/23/2016 02/16/2015  PHQ - 2 Score 0 0 0 0  PHQ- 9 Score 0 - - -     Cognitive Function: Pt declined screening today.         Immunization History  Administered Date(s) Administered  . Influenza, High Dose Seasonal PF 10/02/2016  . Pneumococcal Conjugate-13 10/02/2016    Qualifies for Shingles Vaccine? Due for Shingles vaccine. Declined my offer to administer today. Education has been provided regarding the importance of this vaccine. Pt has been advised to call her insurance company to determine her out of pocket expense. Advised she may also receive this vaccine at her local pharmacy or Health Dept. Verbalized acceptance and understanding.  Screening Tests Health Maintenance  Topic Date Due  . Hepatitis C Screening  1945-03-22  . TETANUS/TDAP  01/25/1962  .  COLONOSCOPY  10/03/2017 (Originally 01/25/1993)  . PNA vac Low Risk Adult (2 of 2 - PPSV23) 10/02/2017  . COLON CANCER SCREENING ANNUAL FOBT  10/03/2017  . MAMMOGRAM  08/16/2018  . INFLUENZA VACCINE  Completed  . DEXA SCAN  Completed    Cancer Screenings: Lung: Low Dose CT Chest recommended if Age 87-80 years, 30 pack-year currently smoking OR have quit w/in 15years. Patient does qualify but declines order.  Breast:  Up to date on Mammogram? Yes   Up to date of Bone Density/Dexa? Yes Colorectal: FOBT up to date  Additional Screenings:  Hepatitis C Screening: Ordered today.     Plan:  I have  personally reviewed and addressed the Medicare Annual Wellness questionnaire and have noted the following in the patient's chart:  A. Medical and social history B. Use of alcohol, tobacco or illicit drugs  C. Current medications and supplements D. Functional ability and status E.  Nutritional status F.  Physical activity G. Advance directives H. List of other physicians I.  Hospitalizations, surgeries, and ER visits in previous 12 months J.  [place] such as hearing and vision if needed, cognitive and depression L. Referrals and appointments - none  In addition, I have reviewed and discussed with patient certain preventive protocols, quality metrics, and best practice recommendations. A written personalized care plan for preventive services as well as general preventive health recommendations were provided to patient.  See attached scanned questionnaire for additional information.   Signed,  Fabio Neighbors, LPN Nurse Health Advisor   Nurse Recommendations: Hep C lab ordered today. Pt declined the tetanus and low dose CT scan orders today.   Reviewed documentation and recommendations by Nurse Health Advisor. Was available for consultation. Will schedule Hep C with remainder of future order for other fasting labs.

## 2017-03-12 NOTE — Progress Notes (Signed)
Patient: Katie Carter, Female    DOB: Mar 24, 1945, 72 y.o.   MRN: [id number] Visit Date: 03/12/2017  Today's Provider: [name], PA   Chief Complaint  Patient presents with  . Annual Exam   Subjective:     Complete Physical Katie Carter is a 72 y.o. female. She feels well. She reports exercising none. She reports she is sleeping well.  -----------------------------------------------------------   Review of Systems  Constitutional: Negative.   HENT: Positive for rhinorrhea and sneezing.   Eyes: Negative.   Respiratory: Negative.   Cardiovascular: Negative.   Gastrointestinal: Negative.   Endocrine: Negative.   Genitourinary: Negative.   Musculoskeletal: Negative.   Skin: Negative.   Allergic/Immunologic: Negative.   Neurological: Negative.   Hematological: Negative.   Psychiatric/Behavioral: Negative.     Social History   Socioeconomic History  . Marital status: Divorced    Spouse name: Not on file  . Number of children: 2  . Years of education: Not on file  . Highest education level: 9th grade  Social Needs  . Financial resource strain: Not hard at all  . Food insecurity - worry: Never true  . Food insecurity - inability: Never true  . Transportation needs - medical: No  . Transportation needs - non-medical: No  Occupational History  . Occupation: retired  Tobacco Use  . Smoking status: Current Every Day Smoker    Packs/day: 0.50    Types: Cigarettes  . Smokeless tobacco: Never Used  Substance and Sexual Activity  . Alcohol use: Yes    Comment: Occasionally a couple beers/monthly  . Drug use: No  . Sexual activity: Not on file  Other Topics Concern  . Not on file  Social History Narrative   1 daughter living and has 1 deceased.    Past Medical History:  Diagnosis Date  . Chronic airway obstruction ([place])   . Hypercholesteremia   . Lumbago 05/06/2006  . Thyroid disease 10/15/2005  . Tobacco use disorder     Patient Active  Problem List   Diagnosis Date Noted  . Hypercholesteremia 12/10/2014  . CAFL (chronic airflow limitation) ([place]) 02/24/2009  . LBP (low back pain) 05/06/2006  . Acid reflux 10/15/2005  . Adult hypothyroidism 10/15/2005  . Primary localized osteoarthrosis, lower leg 10/15/2005  . Tobacco use 10/15/2005   Past Surgical History:  Procedure Laterality Date  . ABDOMINAL HYSTERECTOMY  1960   abdominal Hysterectomy with bilateral salpingo-oophorectomy  . APPENDECTOMY    . CATARACT EXTRACTION Right 10/2012  . Surgical Fixation  1981   of the left knee fracture   Her family history includes Alzheimer's disease in her mother; Cancer in her brother.      Current Outpatient Medications:  .  albuterol (PROVENTIL HFA;VENTOLIN HFA) 108 (90 BASE) MCG/ACT inhaler, Inhale into the lungs every 6 (six) hours as needed for wheezing or shortness of breath., Disp: , Rfl:  .  calcium-vitamin D (OSCAL WITH D) 500-200 MG-UNIT tablet, Take 1 tablet by mouth daily with breakfast., Disp: , Rfl:  .  Omega-3 Fatty Acids (FISH OIL) 1000 MG CAPS, Take by mouth daily., Disp: , Rfl:  .  ranitidine (ZANTAC) 150 MG tablet, Take 150 mg by mouth 2 (two) times daily., Disp: , Rfl:  .  fexofenadine (ALLEGRA ALLERGY) 180 MG tablet, Take 1 tablet (180 mg total) by mouth daily. (Patient not taking: Reported on 03/12/2017), Disp: 90 tablet, Rfl: 0 .  fluticasone (FLONASE) 50 MCG/ACT nasal spray, Place 2 sprays  into both nostrils daily. (Patient not taking: Reported on 03/12/2017), Disp: 16 g, Rfl: 6  Patient Care Team: Chrismon, Vickki Muff, PA as PCP - General (Family Medicine)    Objective:   Vitals: BP 132/72 (BP Location: Right Arm, Patient Position: Sitting, Cuff Size: Normal)   Pulse 72   Temp 98.2 F (36.8 C) (Oral)   Ht 5\' 4"  (1.626 m)   Wt 139 lb (63 kg)   BMI 23.86 kg/m  Wt Readings from Last 3 Encounters:  03/12/17 139 lb (63 kg)  03/12/17 139 lb (63 kg)  01/24/17 136 lb (61.7 kg)   Physical Exam  Activities  of Daily Living In your present state of health, do you have any difficulty performing the following activities: 03/12/2017  Hearing? N  Vision? N  Difficulty concentrating or making decisions? N  Walking or climbing stairs? N  Dressing or bathing? N  Doing errands, shopping? N  Preparing Food and eating ? N  Using the Toilet? N  In the past six months, have you accidently leaked urine? N  Do you have problems with loss of bowel control? N  Managing your Medications? N  Managing your Finances? N  Housekeeping or managing your Housekeeping? N  Some recent data might be hidden   Fall Risk Assessment Fall Risk  03/12/2017 02/23/2016 02/16/2015  Falls in the past year? No No No   Depression Screen PHQ 2/9 Scores 03/12/2017 03/12/2017 02/23/2016 02/16/2015  PHQ - 2 Score 0 0 0 0  PHQ- 9 Score 0 - - -   Assessment & Plan:    Annual Physical Reviewed patient's Family Medical History Reviewed and updated list of patient's medical providers Assessment of cognitive impairment was done Assessed patient's functional ability Established a written schedule for health screening [place] Completed and Reviewed  Exercise Activities and Dietary recommendations Goals    . Exercise 3x per week (30 min per time)     Recommend to start exercising 3 days a week for at least minutes a time. Pt to start exercising when the weather warms up.        Immunization History  Administered Date(s) Administered  . Influenza, High Dose Seasonal PF 10/02/2016  . Pneumococcal Conjugate-13 10/02/2016    Health Maintenance  Topic Date Due  . Hepatitis C Screening  [birth year]  . COLONOSCOPY  10/03/2017 (Originally 01/25/1993)  . TETANUS/TDAP  03/13/2018 (Originally 01/25/1962)  . PNA vac Low Risk Adult (2 of 2 - PPSV23) 10/02/2017  . COLON CANCER SCREENING ANNUAL FOBT  10/03/2017  . MAMMOGRAM  08/16/2018  . INFLUENZA VACCINE  Completed  . DEXA SCAN  Completed     Discussed health  benefits of physical activity, and encouraged her to engage in regular exercise appropriate for her age and condition.    ------------------------------------------------------------------------------------------------------------  1. Hypercholesteremia Triglycerides 220 and LDL 138 a year ago. Compliance with diet and exercise has been questionable. Has refused medications in the past. Wants to recheck labs and possibly consider medications. - CBC with Differential/Platelet; Future - Comprehensive metabolic panel; Future - Lipid panel; Future - TSH; Future  2. Tobacco use Still smoking at least 1/2 ppd. Has not had to use the Proventil-HFA recently. Advised to taper down the stop. May use Nicotine patches prn to reduce cravings. Recheck progress in a month or two.  3. Need for hepatitis C screening test - Hepatitis C antibody; Future  4. Annual physical exam General health stable. Completed medicare wellness screening. Given anticipatory guidance  counselling.    [name], PA  [facility]

## 2017-09-09 ENCOUNTER — Encounter (HOSPITAL_COMMUNITY): Payer: Self-pay

## 2017-09-09 ENCOUNTER — Emergency Department (HOSPITAL_COMMUNITY)
Admission: EM | Admit: 2017-09-09 | Discharge: 2017-09-09 | Disposition: A | Discharge status: Home / Self Care | Payer: Medicare Other | Attending: Emergency Medicine | Admitting: Emergency Medicine

## 2017-09-09 ENCOUNTER — Emergency Department (HOSPITAL_COMMUNITY): Payer: Medicare Other

## 2017-09-09 DIAGNOSIS — M25562 Pain in left knee: Secondary | ICD-10-CM | POA: Diagnosis not present

## 2017-09-09 DIAGNOSIS — G8929 Other chronic pain: Secondary | ICD-10-CM | POA: Diagnosis not present

## 2017-09-09 DIAGNOSIS — F17210 Nicotine dependence, cigarettes, uncomplicated: Secondary | ICD-10-CM | POA: Insufficient documentation

## 2017-09-09 DIAGNOSIS — E039 Hypothyroidism, unspecified: Secondary | ICD-10-CM | POA: Diagnosis not present

## 2017-09-09 DIAGNOSIS — Z79899 Other long term (current) drug therapy: Secondary | ICD-10-CM | POA: Insufficient documentation

## 2017-09-09 MED ORDER — MELOXICAM 7.5 MG PO TABS: 7.5000 mg | ORAL_TABLET | Freq: Every day | ORAL | 0 refills | Status: DC

## 2017-09-09 NOTE — Discharge Instructions (Addendum)
I have provided NSAIDS and a knee brace. Please take one tablet daily.  June appointment with your PCP, as you may need an orthopedic referral.  If you experience any leg swelling, fever, chest pain please return to the ED for reevaluation.

## 2017-09-09 NOTE — ED Triage Notes (Signed)
Patient has hx of arthritis and increased left knee pain x 3 days, no trauma. Pain with any ambulation

## 2017-09-09 NOTE — ED Provider Notes (Signed)
[facility] EMERGENCY DEPARTMENT Provider Note   CSN: [id number] Arrival date & time: 09/09/17  0847     History   Chief Complaint No chief complaint on file.   HPI Katie Carter is a 72 y.o. female.  72 y/o female with a PMH Hypercholesteremia, Thyroid presents to the ED with a chief complaint of left knee pain. Patient has a history of osteoarthrosis of the left knee post fracture repair. She states her symptoms worsen yesterday. Patient states she has been walking at home with a cane. She reports the pain is worse with weight bearing. She has tried [facility] arthritis but states no relieve in symptoms.      Past Medical History:  Diagnosis Date  . Chronic airway obstruction ([place])   . Hypercholesteremia   . Lumbago 05/06/2006  . Thyroid disease 10/15/2005  . Tobacco use disorder     Patient Active Problem List   Diagnosis Date Noted  . Hypercholesteremia 12/10/2014  . CAFL (chronic airflow limitation) ([place]) 02/24/2009  . LBP (low back pain) 05/06/2006  . Acid reflux 10/15/2005  . Adult hypothyroidism 10/15/2005  . Primary localized osteoarthrosis, lower leg 10/15/2005  . Tobacco use 10/15/2005    Past Surgical History:  Procedure Laterality Date  . ABDOMINAL HYSTERECTOMY  1960   abdominal Hysterectomy with bilateral salpingo-oophorectomy  . APPENDECTOMY    . CATARACT EXTRACTION Right 10/2012  . Surgical Fixation  1981   of the left knee fracture     OB History   None      Home Medications    Prior to Admission medications   Medication Sig Start Date End Date Taking? Authorizing Provider  albuterol (PROVENTIL HFA;VENTOLIN HFA) 108 (90 BASE) MCG/ACT inhaler Inhale into the lungs every 6 (six) hours as needed for wheezing or shortness of breath.    [provider]  calcium-vitamin D (OSCAL WITH D) 500-200 MG-UNIT tablet Take 1 tablet by mouth daily with breakfast.    [provider]  fexofenadine (ALLEGRA ALLERGY) 180 MG  tablet Take 1 tablet (180 mg total) by mouth daily. Patient not taking: Reported on 03/12/2017 01/24/17 04/24/17  Trinna Post, PA-C  fluticasone [facility]) 50 MCG/ACT nasal spray Place 2 sprays into both nostrils daily. Patient not taking: Reported on 03/12/2017 01/24/17   Trinna Post, PA-C  meloxicam (MOBIC) 7.5 MG tablet Take 1 tablet (7.5 mg total) by mouth daily for 14 days. 09/09/17 09/23/17  Janeece Fitting, PA-C  Omega-3 Fatty Acids (FISH OIL) 1000 MG CAPS Take by mouth daily.    [provider]  ranitidine (ZANTAC) 150 MG tablet Take 150 mg by mouth 2 (two) times daily.    [provider]    Family History Family History  Problem Relation Age of Onset  . Alzheimer's disease Mother   . Cancer Brother     Social History Social History   Tobacco Use  . Smoking status: Current Every Day Smoker    Packs/day: 0.50    Types: Cigarettes  . Smokeless tobacco: Never Used  Substance Use Topics  . Alcohol use: Yes    Comment: Occasionally a couple beers/monthly  . Drug use: No     Allergies   Tramadol   Review of Systems Review of Systems  Constitutional: Negative for fever.  Cardiovascular: Negative for chest pain and leg swelling.  Musculoskeletal: Positive for arthralgias and joint swelling.  All other systems reviewed and are negative.    Physical Exam Updated Vital Signs BP 126/76 (  BP Location: Right Arm)   Pulse 75   Temp 97.7 F (36.5 C) (Oral)   Resp 18   SpO2 96%   Physical Exam  Constitutional: She is oriented to person, place, and time. She appears well-developed and well-nourished.  Neck: Normal range of motion. Neck supple.  Cardiovascular: Normal heart sounds.  Pulmonary/Chest: Effort normal.  Abdominal: Soft.  Musculoskeletal: She exhibits no edema, tenderness or deformity.       Left knee: She exhibits swelling. She exhibits normal range of motion, no effusion, no ecchymosis, no deformity, no laceration, no erythema and normal  alignment.       Legs: No tenderness to palpation, pain present with flexion of the knee not extension. Pulses present. No calf tenderness.  Neurological: She is alert and oriented to person, place, and time.  Skin: Skin is warm and dry. Capillary refill takes less than 2 seconds.  Nursing note and vitals reviewed.    ED Treatments / Results  Labs (all labs ordered are listed, but only abnormal results are displayed) Labs Reviewed - No data to display  EKG None  Radiology Dg Knee Complete 4 Views Left  Result Date: 09/09/2017 CLINICAL DATA:  Worsening left knee pain over the past 3 days. Remote history of right knee surgery. EXAM: LEFT KNEE - COMPLETE 4+ VIEW COMPARISON:  None. FINDINGS: There is no acute bony or joint abnormality. 2 bone staples are seen in the lateral tibial plateau. The patient has severe tricompartmental osteoarthritis, worst medially. No joint effusion. Chondrocalcinosis noted. IMPRESSION: No acute abnormality. Advanced tricompartmental osteoarthritis. Postoperative change medial tibial plateau. Chondrocalcinosis. Electronically Signed   By: Inge Rise M.D.   On: 09/09/2017 09:23    Procedures Procedures (including critical care time)  Medications Ordered in ED Medications - No data to display   Initial Impression / Assessment and Plan / ED Course  I have reviewed the triage vital signs and the nursing notes.  Pertinent labs & imaging results that were available during my care of the patient were reviewed by me and considered in my medical decision making (see chart for details).     Patient presents with left knee pain. She previous history of arthritis, has had a left knee fracture.  She states she is been walking with a cane at home, she has tried taking BC arthritis powder but states no relief in symptoms.  Patient states last time she had a flareup like this she went to her PCP office in [place] and received a cortisone shot to her knee.  Upon  examination the left knee looks significantly swollen compared to the right knee.  There is no pain upon palpation, no erythema present, no calf tenderness.  Patient denies any fever, trauma.   DG left knee showed severe tricompartmental or osteoarthritis worse medially.  There is no joint effusion present.  Chondrocalcinosis noted.  At this time I will send patient home on NSAIDs, and provide her with a knee brace for comfort. I will instruct her to go back to her PCPs office as she may need an orthopedic referral.  Patient agrees and understands plan.  Return precautions provided.   Final Clinical Impressions(s) / ED Diagnoses   Final diagnoses:  Chronic pain of left knee    ED Discharge Orders         Ordered    meloxicam (MOBIC) 7.5 MG tablet  Daily     09/09/17 0942           Janeece Fitting,  PA-C 09/09/17 [id number]    Virgel Manifold, MD 09/10/17 [phone]

## 2017-09-12 ENCOUNTER — Other Ambulatory Visit: Payer: Self-pay

## 2017-09-16 ENCOUNTER — Ambulatory Visit (INDEPENDENT_AMBULATORY_CARE_PROVIDER_SITE_OTHER): Payer: Medicare Other | Admitting: Family Medicine

## 2017-09-16 ENCOUNTER — Encounter: Payer: Self-pay | Admitting: Family Medicine

## 2017-09-16 VITALS — BP 120/60 | HR 75 | Temp 98.1°F | Resp 16 | Wt 138.0 lb

## 2017-09-16 DIAGNOSIS — M1712 Unilateral primary osteoarthritis, left knee: Secondary | ICD-10-CM | POA: Diagnosis not present

## 2017-09-16 MED ORDER — MELOXICAM 7.5 MG PO TABS: 7.5000 mg | ORAL_TABLET | Freq: Every day | ORAL | 0 refills | Status: AC

## 2017-09-16 NOTE — Patient Instructions (Addendum)
May take up to 3000 mg.of Tylenol in addition to meloxicam daily for knee pain pending orthopedic referral.

## 2017-09-16 NOTE — Progress Notes (Signed)
  Subjective:     Patient ID: Katie Carter, female   DOB: Aug 18, 1945, 72 y.o.   MRN: [id number] Chief Complaint  Patient presents with  . Knee Pain    Patient returns to office today for follow up for knee pain after being seen at [facility] on 09/09/17 with complaints of knee pain. Patient states that she has osteoporosis in her left knee and states that she is out of pain medication prescribed at ED. Patient reports swelling around joint and difficulty walking, patient describes pain as sharp and constant.    HPI Wishes an injection in her knee today. I told her we could refer her to orthopedics for further evaluation and treatment. Denies falls. ER x-ray c/w severe tricompartmental arthritis. Prior hx of remote left knee fracture repair. Accompanied by her significant other.  Review of Systems     Objective:   Physical Exam  Constitutional: She appears well-developed and well-nourished. She appears distressed (antalgic gait).  Musculoskeletal: She exhibits deformity ( left knee without erythema or increased warmth).       Assessment:    1. Primary osteoarthritis of left knee: refilled meloxicam - Ambulatory referral to Orthopedic Surgery    Plan:    Discussed adding Tylenol pending orthopedic referral.

## 2017-09-18 DIAGNOSIS — M1712 Unilateral primary osteoarthritis, left knee: Secondary | ICD-10-CM | POA: Diagnosis not present

## 2017-09-18 DIAGNOSIS — Z9181 History of falling: Secondary | ICD-10-CM | POA: Diagnosis not present

## 2017-10-29 DIAGNOSIS — M1712 Unilateral primary osteoarthritis, left knee: Secondary | ICD-10-CM | POA: Diagnosis not present

## 2017-10-31 ENCOUNTER — Ambulatory Visit: Payer: Self-pay | Admitting: Orthopedic Surgery

## 2017-11-04 ENCOUNTER — Telehealth: Payer: Self-pay | Admitting: Family Medicine

## 2017-11-04 ENCOUNTER — Ambulatory Visit: Payer: Self-pay | Admitting: Family Medicine

## 2017-11-04 NOTE — Progress Notes (Deleted)
       Patient: Katie Carter Female    DOB: 1945-09-12   72 y.o.   MRN: [id number] Visit Date: 11/04/2017  Today's Provider: [name], PA   No chief complaint on file.  Subjective:    HPI  Surgical Clearance Patient presents today for surgical clearance. Patient will be having surgery on 11/20/2017 for a total knee arthroplasty.   Allergies  Allergen Reactions  . Tramadol Rash    no further information given.     Current Outpatient Medications:  .  albuterol (PROVENTIL HFA;VENTOLIN HFA) 108 (90 BASE) MCG/ACT inhaler, Inhale into the lungs every 6 (six) hours as needed for wheezing or shortness of breath., Disp: , Rfl:  .  calcium-vitamin D (OSCAL WITH D) 500-200 MG-UNIT tablet, Take 1 tablet by mouth daily with breakfast., Disp: , Rfl:  .  fluticasone (FLONASE) 50 MCG/ACT nasal spray, Place 2 sprays into both nostrils daily. (Patient not taking: Reported on 03/12/2017), Disp: 16 g, Rfl: 6 .  Omega-3 Fatty Acids (FISH OIL) 1000 MG CAPS, Take by mouth daily., Disp: , Rfl:   Review of Systems  Social History   Tobacco Use  . Smoking status: Current Every Day Smoker    Packs/day: 0.50    Types: Cigarettes  . Smokeless tobacco: Never Used  Substance Use Topics  . Alcohol use: Yes    Comment: Occasionally a couple beers/monthly   Objective:   There were no vitals taken for this visit. There were no vitals filed for this visit.   Physical Exam      Assessment & Plan:           [name], PA  [facility]

## 2017-11-04 NOTE — Telephone Encounter (Signed)
Patient was rescheduled for tomorrow at 9am.

## 2017-11-04 NOTE — Telephone Encounter (Signed)
Rescheduled for tomorrow at 9am.

## 2017-11-04 NOTE — Telephone Encounter (Signed)
Wanting Katie Carter to call her back personally. She said she has something to tell him.   Thanks, [employer]

## 2017-11-05 ENCOUNTER — Other Ambulatory Visit: Payer: Self-pay

## 2017-11-05 ENCOUNTER — Ambulatory Visit
Admission: RE | Admit: 2017-11-05 | Discharge: 2017-11-05 | Disposition: A | Discharge status: Home / Self Care | Payer: Medicare Other | Source: Ambulatory Visit | Attending: Family Medicine | Admitting: Family Medicine

## 2017-11-05 ENCOUNTER — Ambulatory Visit (INDEPENDENT_AMBULATORY_CARE_PROVIDER_SITE_OTHER): Payer: Medicare Other | Admitting: Family Medicine

## 2017-11-05 ENCOUNTER — Encounter: Payer: Self-pay | Admitting: Family Medicine

## 2017-11-05 VITALS — BP 120/62 | HR 75 | Temp 97.8°F | Ht 62.0 in | Wt 138.4 lb

## 2017-11-05 DIAGNOSIS — Z01818 Encounter for other preprocedural examination: Secondary | ICD-10-CM | POA: Diagnosis not present

## 2017-11-05 DIAGNOSIS — M1712 Unilateral primary osteoarthritis, left knee: Secondary | ICD-10-CM

## 2017-11-05 DIAGNOSIS — Z01810 Encounter for preprocedural cardiovascular examination: Secondary | ICD-10-CM | POA: Diagnosis not present

## 2017-11-05 DIAGNOSIS — F17200 Nicotine dependence, unspecified, uncomplicated: Secondary | ICD-10-CM | POA: Insufficient documentation

## 2017-11-05 DIAGNOSIS — E0590 Thyrotoxicosis, unspecified without thyrotoxic crisis or storm: Secondary | ICD-10-CM | POA: Diagnosis not present

## 2017-11-05 DIAGNOSIS — E7800 Pure hypercholesterolemia, unspecified: Secondary | ICD-10-CM

## 2017-11-05 DIAGNOSIS — Z720 Tobacco use: Secondary | ICD-10-CM

## 2017-11-05 DIAGNOSIS — J449 Chronic obstructive pulmonary disease, unspecified: Secondary | ICD-10-CM | POA: Diagnosis not present

## 2017-11-05 DIAGNOSIS — Z23 Encounter for immunization: Secondary | ICD-10-CM | POA: Diagnosis not present

## 2017-11-05 DIAGNOSIS — I700 Atherosclerosis of aorta: Secondary | ICD-10-CM | POA: Insufficient documentation

## 2017-11-05 DIAGNOSIS — Z1159 Encounter for screening for other viral diseases: Secondary | ICD-10-CM

## 2017-11-05 LAB — POCT GLYCOSYLATED HEMOGLOBIN (HGB A1C): HEMOGLOBIN A1C: 5.5 % (ref 4.0–5.6)

## 2017-11-05 NOTE — Progress Notes (Signed)
Patient: Katie Carter Female    DOB: [birth year]   72 y.o.   MRN: [id number] Visit Date: 11/05/2017  Today's Provider: [name], PA   Chief Complaint  Patient presents with  . surgical clearance    left knee pain severe.  surgery 11/20/17  . Hand Pain    right side  . Nausea    started this morning 11/05/17   Subjective:    HPI  Scheduled for left knee replacement on 11-20-17 by Dr. Harlow Mares (orthopedist). History of left knee fracture in 1981 with staple in the tibial plateau.     Past Medical History:  Diagnosis Date  . Chronic airway obstruction ([place])   . Hypercholesteremia   . Lumbago 05/06/2006  . Thyroid disease 10/15/2005  . Tobacco use disorder    Past Surgical History:  Procedure Laterality Date  . ABDOMINAL HYSTERECTOMY  1960   abdominal Hysterectomy with bilateral salpingo-oophorectomy  . APPENDECTOMY    . CATARACT EXTRACTION Right 10/2012  . Surgical Fixation  1981   of the left knee fracture   Family History  Problem Relation Age of Onset  . Alzheimer's disease Mother   . Cancer Brother    Allergies  Allergen Reactions  . Meloxicam Other (See Comments)    Severe chest pain  . Tramadol Rash    Current Outpatient Medications:  .  acetaminophen (TYLENOL) 650 MG CR tablet, Take 650 mg by mouth every 8 (eight) hours as needed for pain., Disp: , Rfl:  .  albuterol (PROVENTIL HFA;VENTOLIN HFA) 108 (90 BASE) MCG/ACT inhaler, Inhale 1 puff into the lungs every 6 (six) hours as needed for wheezing or shortness of breath. , Disp: , Rfl:   Review of Systems  Constitutional: Negative.   HENT: Negative.   Eyes: Negative.   Respiratory: Negative.   Cardiovascular: Negative.   Gastrointestinal: Positive for nausea. Negative for abdominal distention, abdominal pain, anal bleeding, blood in stool, constipation, diarrhea, rectal pain and vomiting.  Endocrine: Negative.   Genitourinary: Negative.   Musculoskeletal: Positive for arthralgias (right  hand pain).  Skin: Negative.   Allergic/Immunologic: Negative.   Neurological: Negative.   Hematological: Negative.   Psychiatric/Behavioral: Negative.    Social History   Tobacco Use  . Smoking status: Current Every Day Smoker    Packs/day: 1.00    Years: 50.00    Pack years: 50.00    Types: Cigarettes  . Smokeless tobacco: Never Used  Substance Use Topics  . Alcohol use: Yes    Comment: Occasionally a couple beers/monthly   Objective:   BP 120/62 (BP Location: Right Arm, Patient Position: Sitting, Cuff Size: Normal)   Pulse 75   Temp 97.8 F (36.6 C) (Oral)   Ht 5\' 2"  (1.575 m)   Wt 138 lb 6.4 oz (62.8 kg)   SpO2 97%   BMI 25.31 kg/m  Vitals:   11/05/17 0917  BP: 120/62  Pulse: 75  Temp: 97.8 F (36.6 C)  TempSrc: Oral  SpO2: 97%  Weight: 138 lb 6.4 oz (62.8 kg)  Height: 5\' 2"  (1.575 m)   Physical Exam  Constitutional: She is oriented to person, place, and time. She appears well-developed and well-nourished.  HENT:  Head: Normocephalic and atraumatic.  Right Ear: External ear normal.  Left Ear: External ear normal.  Nose: Nose normal.  Mouth/Throat: Oropharynx is clear and moist.  Eyes: Pupils are equal, round, and reactive to light. Conjunctivae and EOM are normal. Right eye exhibits no discharge.  Neck: Normal range of motion. Neck supple. No tracheal deviation present. No thyromegaly present.  Cardiovascular: Normal rate, regular rhythm, normal heart sounds and intact distal pulses.  No murmur heard. Pulmonary/Chest: Effort normal and breath sounds normal. No respiratory distress. She has no wheezes. She has no rales. She exhibits no tenderness.  Distant breath sounds.  Abdominal: Soft. She exhibits no distension and no mass. There is no tenderness. There is no rebound and no guarding.  Musculoskeletal: Normal range of motion. She exhibits no edema or tenderness.  Enlargement of the left knee joint with history of fracture of the tibial plateau requiring  2 stables in 1981.  Lymphadenopathy:    She has no cervical adenopathy.  Neurological: She is alert and oriented to person, place, and time. She has normal reflexes. She displays normal reflexes. No cranial nerve deficit. She exhibits normal muscle tone. Coordination normal.  Skin: Skin is warm and dry. No rash noted. No erythema.  Psychiatric: She has a normal mood and affect. Her behavior is normal. Judgment and thought content normal.      Assessment & Plan:     1. Preoperative clearance Scheduled for left knee total arthroplasty on 11-20-17. General health stable. Hgb A1C 5.5% and EKG normal today. Medically cleared for surgery. Will release lab order from March 2019 during annual physical to be done before surgery. - EKG 12-Lead - POCT glycosylated hemoglobin (Hb A1C) - DG Chest 2 View  2. Primary localized osteoarthrosis of left lower leg Chronic pain and history of tibial plateau fracture requiring 2 staples in 1981. Worsening pain and degeneration evaluated by orthopedist (Dr. Harlow Mares) who has recommended joint replacement on 11-20-17.  3. Hyperthyroidism No significant weight loss, tremor, exophthalmos, myxedema or palpitations. Normal EKG without arrhythmia. No thyroid nodules/masses. - EKG 12-Lead - POCT glycosylated hemoglobin (Hb A1C)  4. Tobacco use Continues to smoke 1 ppd for the past 50+ years. Recommended she stop and counseled regarding nicotine patches or Chantix. Will check CXR with suspected chronic bronchitis/COPD. - DG Chest 2 View  5. Needs flu shot - Flu vaccine HIGH DOSE PF (Fluzone High dose)  6. Need for Streptococcus pneumoniae vaccination - Pneumococcal polysaccharide vaccine 23-valent greater than or equal to 2yo subcutaneous/IM       [name], PA  [facility]

## 2017-11-06 ENCOUNTER — Inpatient Hospital Stay: Admission: RE | Admit: 2017-11-06 | Payer: Self-pay | Source: Ambulatory Visit

## 2017-11-07 DIAGNOSIS — M25562 Pain in left knee: Secondary | ICD-10-CM | POA: Diagnosis not present

## 2017-11-07 DIAGNOSIS — M25662 Stiffness of left knee, not elsewhere classified: Secondary | ICD-10-CM | POA: Diagnosis not present

## 2017-11-07 DIAGNOSIS — M1712 Unilateral primary osteoarthritis, left knee: Secondary | ICD-10-CM | POA: Diagnosis not present

## 2017-11-11 ENCOUNTER — Encounter
Admission: RE | Admit: 2017-11-11 | Discharge: 2017-11-11 | Disposition: A | Discharge status: Home / Self Care | Payer: Medicare Other | Source: Ambulatory Visit | Attending: Orthopedic Surgery | Admitting: Orthopedic Surgery

## 2017-11-11 ENCOUNTER — Other Ambulatory Visit: Payer: Self-pay

## 2017-11-11 DIAGNOSIS — Z01812 Encounter for preprocedural laboratory examination: Secondary | ICD-10-CM | POA: Diagnosis not present

## 2017-11-11 HISTORY — DX: Unspecified osteoarthritis, unspecified site: M19.90

## 2017-11-11 HISTORY — DX: Dyspnea, unspecified: R06.00

## 2017-11-11 LAB — CBC
HCT: 47.4 % — ABNORMAL HIGH (ref 36.0–46.0)
HEMOGLOBIN: 15.5 g/dL — AB (ref 12.0–15.0)
MCH: 29.2 pg (ref 26.0–34.0)
MCHC: 32.7 g/dL (ref 30.0–36.0)
MCV: 89.4 fL (ref 80.0–100.0)
PLATELETS: 333 10*3/uL (ref 150–400)
RBC: 5.3 MIL/uL — ABNORMAL HIGH (ref 3.87–5.11)
RDW: 13.5 % (ref 11.5–15.5)
WBC: 11.8 10*3/uL — AB (ref 4.0–10.5)
nRBC: 0 % (ref 0.0–0.2)

## 2017-11-11 LAB — BASIC METABOLIC PANEL
ANION GAP: 8 (ref 5–15)
BUN: 16 mg/dL (ref 8–23)
CALCIUM: 9.2 mg/dL (ref 8.9–10.3)
CO2: 27 mmol/L (ref 22–32)
Chloride: 103 mmol/L (ref 98–111)
Creatinine, Ser: 0.87 mg/dL (ref 0.44–1.00)
GFR calc Af Amer: >60 mL/min (ref >60)
Glucose, Bld: 97 mg/dL (ref 70–99)
Potassium: 3.8 mmol/L (ref 3.5–5.1)
SODIUM: 138 mmol/L (ref 135–145)

## 2017-11-11 LAB — URINALYSIS, ROUTINE W REFLEX MICROSCOPIC
Bilirubin Urine: NEGATIVE
GLUCOSE, UA: NEGATIVE mg/dL
HGB URINE DIPSTICK: NEGATIVE
KETONES UR: NEGATIVE mg/dL
LEUKOCYTES UA: NEGATIVE
Nitrite: NEGATIVE
PH: 5 (ref 5.0–8.0)
Protein, ur: NEGATIVE mg/dL
Specific Gravity, Urine: 1.02 (ref 1.005–1.030)

## 2017-11-11 LAB — PROTIME-INR
INR: 1.01
PROTHROMBIN TIME: 13.2 s (ref 11.4–15.2)

## 2017-11-11 LAB — TYPE AND SCREEN
ABO/RH(D): A POS
Antibody Screen: NEGATIVE

## 2017-11-11 LAB — SURGICAL PCR SCREEN
MRSA, PCR: NEGATIVE
STAPHYLOCOCCUS AUREUS: NEGATIVE

## 2017-11-11 LAB — APTT: APTT: 37 s — AB (ref 24–36)

## 2017-11-11 NOTE — Pre-Procedure Instructions (Signed)
Patient medically cleared for surgery by PCP 11/06/15.

## 2017-11-11 NOTE — Patient Instructions (Signed)
Your procedure is scheduled on: Wednesday 11/20/17 Report to [place]. To find out your arrival time please call [phone] between 1PM - 3PM on Tuesday 11/19/17.  Remember: Instructions that are not followed completely may result in serious medical risk, up to and including death, or upon the discretion of your surgeon and anesthesiologist your surgery may need to be rescheduled.     _X__ 1. Do not eat food after midnight the night before your procedure.                 No gum chewing or hard candies. You may drink clear liquids up to 2 hours                 before you are scheduled to arrive for your surgery- DO not drink clear                 liquids within 2 hours of the start of your surgery.                 Clear Liquids include:  water, apple juice without pulp, clear carbohydrate                 drink such as Clearfast or Gatorade, Black Coffee or Tea (Do not add                 anything to coffee or tea).  __X__2.  On the morning of surgery brush your teeth with toothpaste and water, you                 may rinse your mouth with mouthwash if you wish.  Do not swallow any              toothpaste of mouthwash.     _X__ 3.  No Alcohol for 24 hours before or after surgery.   _X__ 4.  Do Not Smoke or use e-cigarettes For 24 Hours Prior to Your Surgery.                 Do not use any chewable tobacco products for at least 6 hours prior to                 surgery.  ____  5.  Bring all medications with you on the day of surgery if instructed.   __X__  6.  Notify your doctor if there is any change in your medical condition      (cold, fever, infections).     Do not wear jewelry, make-up, hairpins, clips or nail polish. Do not wear lotions, powders, or perfumes.  Do not shave 48 hours prior to surgery. Men may shave face and neck. Do not bring valuables to the hospital.    [facility] is not responsible for any belongings or  valuables.  Contacts, dentures/partials or body piercings may not be worn into surgery. Bring a case for your contacts, glasses or hearing aids, a denture cup will be supplied. Leave your suitcase in the car. After surgery it may be brought to your room. For patients admitted to the hospital, discharge time is determined by your treatment team.   Patients discharged the day of surgery will not be allowed to drive home.   Please read over the following fact sheets that you were given:   MRSA Information  __X__ Take these medicines the morning of surgery with A SIP OF WATER:  1. none  2.   3.   4.  5.  6.  ____ Fleet Enema (as directed)   __X__ Use CHG Soap/SAGE wipes as directed  __X__ Use inhalers on the day of surgery  ____ Stop metformin/Janumet/Farxiga 2 days prior to surgery    ____ Take 1/2 of usual insulin dose the night before surgery. No insulin the morning          of surgery.   ____ Stop Blood Thinners Coumadin/Plavix/Xarelto/Pleta/Pradaxa/Eliquis/Effient/Aspirin  on   Or contact your Surgeon, Cardiologist or Medical Doctor regarding  ability to stop your blood thinners  __X__ Stop Anti-inflammatories 7 days before surgery such as Advil, Ibuprofen, Motrin,  BC or Goodies Powder, Naprosyn, Naproxen, Aleve, Aspirin    __X__ Stop all herbal supplements, fish oil or vitamin E until after surgery.    ____ Bring C-Pap to the hospital.

## 2017-11-19 MED ORDER — TRANEXAMIC ACID-NACL 1000-0.7 MG/100ML-% IV SOLN
1000.0000 mg | INTRAVENOUS | Status: AC
  Administered 2017-11-20: 1000 mg via INTRAVENOUS
  Filled 2017-11-19: qty 100

## 2017-11-19 MED ORDER — CEFAZOLIN SODIUM-DEXTROSE 2-4 GM/100ML-% IV SOLN
2.0000 g | INTRAVENOUS | Status: AC
  Administered 2017-11-20: 2 g via INTRAVENOUS

## 2017-11-20 ENCOUNTER — Inpatient Hospital Stay: Payer: Medicare Other | Admitting: Certified Registered"

## 2017-11-20 ENCOUNTER — Encounter
Admission: RE | Disposition: A | Discharge status: Skilled Nursing Facility | Payer: Self-pay | Source: Home / Self Care | Attending: Orthopedic Surgery

## 2017-11-20 ENCOUNTER — Inpatient Hospital Stay
Admission: RE | Admit: 2017-11-20 | Discharge: 2017-11-23 | DRG: 470 | Disposition: A | Discharge status: Skilled Nursing Facility | Payer: Medicare Other | Attending: Orthopedic Surgery | Admitting: Orthopedic Surgery

## 2017-11-20 ENCOUNTER — Other Ambulatory Visit: Payer: Self-pay

## 2017-11-20 ENCOUNTER — Inpatient Hospital Stay: Payer: Medicare Other

## 2017-11-20 DIAGNOSIS — Z8781 Personal history of (healed) traumatic fracture: Secondary | ICD-10-CM

## 2017-11-20 DIAGNOSIS — R1312 Dysphagia, oropharyngeal phase: Secondary | ICD-10-CM | POA: Diagnosis not present

## 2017-11-20 DIAGNOSIS — E0590 Thyrotoxicosis, unspecified without thyrotoxic crisis or storm: Secondary | ICD-10-CM | POA: Diagnosis not present

## 2017-11-20 DIAGNOSIS — Z9049 Acquired absence of other specified parts of digestive tract: Secondary | ICD-10-CM | POA: Diagnosis not present

## 2017-11-20 DIAGNOSIS — D62 Acute posthemorrhagic anemia: Secondary | ICD-10-CM | POA: Diagnosis not present

## 2017-11-20 DIAGNOSIS — M25762 Osteophyte, left knee: Secondary | ICD-10-CM | POA: Diagnosis present

## 2017-11-20 DIAGNOSIS — F17210 Nicotine dependence, cigarettes, uncomplicated: Secondary | ICD-10-CM | POA: Diagnosis present

## 2017-11-20 DIAGNOSIS — M1712 Unilateral primary osteoarthritis, left knee: Principal | ICD-10-CM | POA: Diagnosis present

## 2017-11-20 DIAGNOSIS — Z885 Allergy status to narcotic agent status: Secondary | ICD-10-CM | POA: Diagnosis not present

## 2017-11-20 DIAGNOSIS — Z90710 Acquired absence of both cervix and uterus: Secondary | ICD-10-CM | POA: Diagnosis not present

## 2017-11-20 DIAGNOSIS — Z9841 Cataract extraction status, right eye: Secondary | ICD-10-CM

## 2017-11-20 DIAGNOSIS — R062 Wheezing: Secondary | ICD-10-CM | POA: Diagnosis not present

## 2017-11-20 DIAGNOSIS — Z90722 Acquired absence of ovaries, bilateral: Secondary | ICD-10-CM | POA: Diagnosis not present

## 2017-11-20 DIAGNOSIS — R52 Pain, unspecified: Secondary | ICD-10-CM | POA: Diagnosis not present

## 2017-11-20 DIAGNOSIS — E039 Hypothyroidism, unspecified: Secondary | ICD-10-CM | POA: Diagnosis not present

## 2017-11-20 DIAGNOSIS — Z79899 Other long term (current) drug therapy: Secondary | ICD-10-CM

## 2017-11-20 DIAGNOSIS — Z820 Family history of epilepsy and other diseases of the nervous system: Secondary | ICD-10-CM

## 2017-11-20 DIAGNOSIS — K219 Gastro-esophageal reflux disease without esophagitis: Secondary | ICD-10-CM | POA: Diagnosis not present

## 2017-11-20 DIAGNOSIS — J449 Chronic obstructive pulmonary disease, unspecified: Secondary | ICD-10-CM | POA: Diagnosis not present

## 2017-11-20 DIAGNOSIS — Z9911 Dependence on respirator [ventilator] status: Secondary | ICD-10-CM | POA: Diagnosis not present

## 2017-11-20 DIAGNOSIS — M79641 Pain in right hand: Secondary | ICD-10-CM | POA: Diagnosis present

## 2017-11-20 DIAGNOSIS — Z96652 Presence of left artificial knee joint: Secondary | ICD-10-CM | POA: Diagnosis not present

## 2017-11-20 DIAGNOSIS — Z09 Encounter for follow-up examination after completed treatment for conditions other than malignant neoplasm: Secondary | ICD-10-CM

## 2017-11-20 DIAGNOSIS — E7800 Pure hypercholesterolemia, unspecified: Secondary | ICD-10-CM | POA: Diagnosis not present

## 2017-11-20 DIAGNOSIS — Z471 Aftercare following joint replacement surgery: Secondary | ICD-10-CM | POA: Diagnosis not present

## 2017-11-20 DIAGNOSIS — Z886 Allergy status to analgesic agent status: Secondary | ICD-10-CM

## 2017-11-20 DIAGNOSIS — R0600 Dyspnea, unspecified: Secondary | ICD-10-CM | POA: Diagnosis not present

## 2017-11-20 DIAGNOSIS — R498 Other voice and resonance disorders: Secondary | ICD-10-CM | POA: Diagnosis not present

## 2017-11-20 DIAGNOSIS — M6281 Muscle weakness (generalized): Secondary | ICD-10-CM | POA: Diagnosis not present

## 2017-11-20 DIAGNOSIS — K5900 Constipation, unspecified: Secondary | ICD-10-CM | POA: Diagnosis not present

## 2017-11-20 HISTORY — PX: TOTAL KNEE ARTHROPLASTY: SHX125

## 2017-11-20 LAB — ABO/RH: ABO/RH(D): A POS

## 2017-11-20 SURGERY — ARTHROPLASTY, KNEE, TOTAL
Anesthesia: Spinal | Laterality: Left

## 2017-11-20 MED ORDER — OXYCODONE HCL 5 MG PO TABS
5.0000 mg | ORAL_TABLET | ORAL | Status: DC | PRN
  Administered 2017-11-21 – 2017-11-22 (×8): 10 mg via ORAL
  Filled 2017-11-20 (×4): qty 2
  Filled 2017-11-20: qty 1
  Filled 2017-11-20 (×4): qty 2

## 2017-11-20 MED ORDER — MIDAZOLAM HCL 2 MG/2ML IJ SOLN
INTRAMUSCULAR | Status: AC
  Administered 2017-11-20: 1 mg via INTRAVENOUS
  Filled 2017-11-20: qty 2

## 2017-11-20 MED ORDER — METOCLOPRAMIDE HCL 10 MG PO TABS: 5.0000 mg | ORAL_TABLET | Freq: Three times a day (TID) | ORAL | Status: DC | PRN

## 2017-11-20 MED ORDER — MENTHOL 3 MG MT LOZG: 1.0000 | LOZENGE | OROMUCOSAL | Status: DC | PRN

## 2017-11-20 MED ORDER — ALBUTEROL SULFATE HFA 108 (90 BASE) MCG/ACT IN AERS: 1.0000 | INHALATION_SPRAY | Freq: Four times a day (QID) | RESPIRATORY_TRACT | Status: DC | PRN

## 2017-11-20 MED ORDER — SODIUM CHLORIDE 0.9 % IV SOLN
INTRAVENOUS | Status: DC | PRN
  Administered 2017-11-20: 25 ug/min via INTRAVENOUS

## 2017-11-20 MED ORDER — OXYBUTYNIN CHLORIDE 5 MG PO TABS: 5.0000 mg | ORAL_TABLET | ORAL | Status: DC | PRN

## 2017-11-20 MED ORDER — ACETAMINOPHEN 325 MG PO TABS
325.0000 mg | ORAL_TABLET | Freq: Four times a day (QID) | ORAL | Status: DC | PRN
  Administered 2017-11-22: 325 mg via ORAL
  Filled 2017-11-20: qty 1

## 2017-11-20 MED ORDER — PROPOFOL 10 MG/ML IV BOLUS
INTRAVENOUS | Status: AC
  Filled 2017-11-20: qty 20

## 2017-11-20 MED ORDER — GABAPENTIN 300 MG PO CAPS
300.0000 mg | ORAL_CAPSULE | Freq: Three times a day (TID) | ORAL | Status: DC
  Administered 2017-11-20 – 2017-11-23 (×9): 300 mg via ORAL
  Filled 2017-11-20 (×9): qty 1

## 2017-11-20 MED ORDER — ASPIRIN 81 MG PO CHEW
81.0000 mg | CHEWABLE_TABLET | Freq: Two times a day (BID) | ORAL | Status: DC
  Administered 2017-11-20 – 2017-11-23 (×6): 81 mg via ORAL
  Filled 2017-11-20 (×6): qty 1

## 2017-11-20 MED ORDER — ROPIVACAINE HCL 5 MG/ML IJ SOLN
INTRAMUSCULAR | Status: AC
  Filled 2017-11-20: qty 30

## 2017-11-20 MED ORDER — MORPHINE SULFATE (PF) 2 MG/ML IV SOLN
0.5000 mg | INTRAVENOUS | Status: DC | PRN
  Administered 2017-11-20: 1 mg via INTRAVENOUS
  Filled 2017-11-20: qty 1

## 2017-11-20 MED ORDER — MAGNESIUM HYDROXIDE 400 MG/5ML PO SUSP
30.0000 mL | Freq: Every day | ORAL | Status: DC | PRN
  Administered 2017-11-21: 30 mL via ORAL
  Filled 2017-11-20: qty 30

## 2017-11-20 MED ORDER — BUPIVACAINE LIPOSOME 1.3 % IJ SUSP: 20.0000 mL | Freq: Once | INTRAMUSCULAR | Status: DC

## 2017-11-20 MED ORDER — DIAZEPAM 5 MG/ML PO CONC: 5.0000 mg | Freq: Three times a day (TID) | ORAL | Status: DC | PRN

## 2017-11-20 MED ORDER — SODIUM CHLORIDE 0.9 % IR SOLN
Status: DC | PRN
  Administered 2017-11-20 (×2)

## 2017-11-20 MED ORDER — CEFAZOLIN SODIUM-DEXTROSE 2-4 GM/100ML-% IV SOLN
INTRAVENOUS | Status: AC
  Filled 2017-11-20: qty 100

## 2017-11-20 MED ORDER — PROPOFOL 500 MG/50ML IV EMUL
INTRAVENOUS | Status: AC
  Filled 2017-11-20: qty 50

## 2017-11-20 MED ORDER — FENTANYL CITRATE (PF) 100 MCG/2ML IJ SOLN
INTRAMUSCULAR | Status: DC | PRN
  Administered 2017-11-20: 50 ug via INTRAVENOUS

## 2017-11-20 MED ORDER — LACTATED RINGERS IV SOLN: INTRAVENOUS | Status: DC

## 2017-11-20 MED ORDER — BISACODYL 10 MG RE SUPP
10.0000 mg | Freq: Every day | RECTAL | Status: DC | PRN
  Administered 2017-11-22: 10 mg via RECTAL
  Filled 2017-11-20: qty 1

## 2017-11-20 MED ORDER — HYDROCODONE-ACETAMINOPHEN 5-325 MG PO TABS
1.0000 | ORAL_TABLET | ORAL | Status: DC | PRN
  Filled 2017-11-20: qty 2
  Filled 2017-11-20: qty 1

## 2017-11-20 MED ORDER — CEFAZOLIN SODIUM-DEXTROSE 1-4 GM/50ML-% IV SOLN
1.0000 g | Freq: Four times a day (QID) | INTRAVENOUS | Status: AC
  Administered 2017-11-20 (×2): 1 g via INTRAVENOUS
  Filled 2017-11-20 (×2): qty 50

## 2017-11-20 MED ORDER — BUPIVACAINE HCL (PF) 0.5 % IJ SOLN
INTRAMUSCULAR | Status: DC | PRN
  Administered 2017-11-20: 2.7 mL

## 2017-11-20 MED ORDER — PHENOL 1.4 % MT LIQD: 1.0000 | OROMUCOSAL | Status: DC | PRN

## 2017-11-20 MED ORDER — SODIUM CHLORIDE 0.9 % IV SOLN
INTRAVENOUS | Status: DC | PRN
  Administered 2017-11-20: 60 mL

## 2017-11-20 MED ORDER — FENTANYL CITRATE (PF) 100 MCG/2ML IJ SOLN: 25.0000 ug | INTRAMUSCULAR | Status: DC | PRN

## 2017-11-20 MED ORDER — BUPIVACAINE LIPOSOME 1.3 % IJ SUSP
INTRAMUSCULAR | Status: AC
  Filled 2017-11-20: qty 20

## 2017-11-20 MED ORDER — DOCUSATE SODIUM 100 MG PO CAPS
100.0000 mg | ORAL_CAPSULE | Freq: Two times a day (BID) | ORAL | Status: DC
  Administered 2017-11-20 – 2017-11-23 (×6): 100 mg via ORAL
  Filled 2017-11-20 (×6): qty 1

## 2017-11-20 MED ORDER — FENTANYL CITRATE (PF) 100 MCG/2ML IJ SOLN
INTRAMUSCULAR | Status: AC
  Administered 2017-11-20: 50 ug via INTRAVENOUS
  Filled 2017-11-20: qty 2

## 2017-11-20 MED ORDER — ALBUTEROL SULFATE (2.5 MG/3ML) 0.083% IN NEBU: 2.5000 mg | INHALATION_SOLUTION | Freq: Four times a day (QID) | RESPIRATORY_TRACT | Status: DC | PRN

## 2017-11-20 MED ORDER — FENTANYL CITRATE (PF) 100 MCG/2ML IJ SOLN
INTRAMUSCULAR | Status: AC
  Filled 2017-11-20: qty 2

## 2017-11-20 MED ORDER — BUPIVACAINE-EPINEPHRINE (PF) 0.5% -1:200000 IJ SOLN
INTRAMUSCULAR | Status: AC
  Filled 2017-11-20: qty 30

## 2017-11-20 MED ORDER — MIDAZOLAM HCL 2 MG/2ML IJ SOLN
1.0000 mg | Freq: Once | INTRAMUSCULAR | Status: AC
  Administered 2017-11-20: 1 mg via INTRAVENOUS

## 2017-11-20 MED ORDER — LACTATED RINGERS IV SOLN
INTRAVENOUS | Status: DC
  Administered 2017-11-20: 07:00:00 via INTRAVENOUS

## 2017-11-20 MED ORDER — ONDANSETRON HCL 4 MG PO TABS: 4.0000 mg | ORAL_TABLET | Freq: Four times a day (QID) | ORAL | Status: DC | PRN

## 2017-11-20 MED ORDER — DIAZEPAM 5 MG PO TABS
5.0000 mg | ORAL_TABLET | Freq: Three times a day (TID) | ORAL | Status: DC | PRN
  Administered 2017-11-20: 5 mg via ORAL
  Filled 2017-11-20 (×3): qty 1

## 2017-11-20 MED ORDER — SODIUM CHLORIDE FLUSH 0.9 % IV SOLN
INTRAVENOUS | Status: AC
  Filled 2017-11-20: qty 20

## 2017-11-20 MED ORDER — LACTATED RINGERS IV SOLN
INTRAVENOUS | Status: DC
  Administered 2017-11-20 – 2017-11-21 (×2): via INTRAVENOUS

## 2017-11-20 MED ORDER — FAMOTIDINE 20 MG PO TABS
20.0000 mg | ORAL_TABLET | Freq: Once | ORAL | Status: AC
  Administered 2017-11-20: 20 mg via ORAL

## 2017-11-20 MED ORDER — ONDANSETRON HCL 4 MG/2ML IJ SOLN
4.0000 mg | Freq: Four times a day (QID) | INTRAMUSCULAR | Status: DC | PRN
  Administered 2017-11-20: 4 mg via INTRAVENOUS
  Filled 2017-11-20: qty 2

## 2017-11-20 MED ORDER — PROPOFOL 500 MG/50ML IV EMUL
INTRAVENOUS | Status: DC | PRN
  Administered 2017-11-20: 75 ug/kg/min via INTRAVENOUS

## 2017-11-20 MED ORDER — SODIUM CHLORIDE FLUSH 0.9 % IV SOLN
INTRAVENOUS | Status: AC
  Filled 2017-11-20: qty 40

## 2017-11-20 MED ORDER — BACITRACIN 50000 UNITS IM SOLR
INTRAMUSCULAR | Status: AC
  Filled 2017-11-20: qty 2

## 2017-11-20 MED ORDER — FENTANYL CITRATE (PF) 100 MCG/2ML IJ SOLN
50.0000 ug | Freq: Once | INTRAMUSCULAR | Status: AC
  Administered 2017-11-20: 50 ug via INTRAVENOUS

## 2017-11-20 MED ORDER — FAMOTIDINE 20 MG PO TABS
ORAL_TABLET | ORAL | Status: AC
  Administered 2017-11-20: 20 mg via ORAL
  Filled 2017-11-20: qty 1

## 2017-11-20 MED ORDER — HYDROMORPHONE HCL 1 MG/ML IJ SOLN
1.0000 mg | INTRAMUSCULAR | Status: DC | PRN
  Administered 2017-11-20 – 2017-11-21 (×3): 1 mg via INTRAVENOUS
  Filled 2017-11-20 (×3): qty 1

## 2017-11-20 MED ORDER — METOCLOPRAMIDE HCL 5 MG/ML IJ SOLN: 5.0000 mg | Freq: Three times a day (TID) | INTRAMUSCULAR | Status: DC | PRN

## 2017-11-20 MED ORDER — NICOTINE 21 MG/24HR TD PT24
21.0000 mg | MEDICATED_PATCH | Freq: Every day | TRANSDERMAL | Status: DC
  Administered 2017-11-21 – 2017-11-23 (×3): 21 mg via TRANSDERMAL
  Filled 2017-11-20 (×4): qty 1

## 2017-11-20 MED ORDER — BUPIVACAINE-EPINEPHRINE (PF) 0.5% -1:200000 IJ SOLN
INTRAMUSCULAR | Status: DC | PRN
  Administered 2017-11-20: 30 mL
  Administered 2017-11-20: 10 mL via PERINEURAL

## 2017-11-20 MED ORDER — PROPOFOL 10 MG/ML IV BOLUS
INTRAVENOUS | Status: DC | PRN
  Administered 2017-11-20: 30 mg via INTRAVENOUS

## 2017-11-20 MED ORDER — HYDROMORPHONE HCL 1 MG/ML IJ SOLN
INTRAMUSCULAR | Status: AC
  Administered 2017-11-20: 15:00:00
  Filled 2017-11-20: qty 1

## 2017-11-20 MED ORDER — PHENYLEPHRINE HCL 10 MG/ML IJ SOLN
INTRAMUSCULAR | Status: AC
  Filled 2017-11-20: qty 1

## 2017-11-20 MED ORDER — MAGNESIUM CITRATE PO SOLN: 1.0000 | Freq: Once | ORAL | Status: DC | PRN

## 2017-11-20 MED ORDER — ACETAMINOPHEN 500 MG PO TABS
500.0000 mg | ORAL_TABLET | Freq: Four times a day (QID) | ORAL | Status: AC
  Administered 2017-11-20 – 2017-11-21 (×2): 500 mg via ORAL
  Filled 2017-11-20 (×2): qty 1

## 2017-11-20 MED ORDER — ONDANSETRON HCL 4 MG/2ML IJ SOLN: 4.0000 mg | Freq: Once | INTRAMUSCULAR | Status: DC | PRN

## 2017-11-20 MED ORDER — CHLORHEXIDINE GLUCONATE 4 % EX LIQD: 60.0000 mL | Freq: Once | CUTANEOUS | Status: DC

## 2017-11-20 MED ORDER — HYDROCODONE-ACETAMINOPHEN 7.5-325 MG PO TABS: 1.0000 | ORAL_TABLET | ORAL | Status: DC | PRN

## 2017-11-20 MED ORDER — LIDOCAINE HCL (PF) 2 % IJ SOLN
INTRAMUSCULAR | Status: AC
  Filled 2017-11-20: qty 10

## 2017-11-20 SURGICAL SUPPLY — 64 items
BASEPLATE TIBIAL SZ 2 LT (Knees) ×3 IMPLANT
BLADE SAW 18WX90L 1.27 THK (BLADE) ×3 IMPLANT
BLADE SAW SAG 25X90X1.19 (BLADE) ×3 IMPLANT
BONE CANC CHIPS 40CC CAN1/2 (Bone Implant) ×3 IMPLANT
BOWL CEMENT MIX W/ADAPTER (MISCELLANEOUS) ×3 IMPLANT
BRUSH SCRUB EZ  4% CHG (MISCELLANEOUS) ×4
BRUSH SCRUB EZ 4% CHG (MISCELLANEOUS) ×2 IMPLANT
CANISTER SUCT 1200ML W/VALVE (MISCELLANEOUS) ×3 IMPLANT
CANISTER SUCT 3000ML PPV (MISCELLANEOUS) ×6 IMPLANT
CEMENT BONE GENTAMICIN (Cement) ×6 IMPLANT
CEMENT BONE GENTAMICIN PWDR (Cement) ×2 IMPLANT
CHIPS CANC BONE 40CC CAN1/2 (Bone Implant) ×1 IMPLANT
CHLORAPREP W/TINT 26ML (MISCELLANEOUS) ×6 IMPLANT
CNTNR SPEC 2.5X3XGRAD LEK (MISCELLANEOUS) ×1
COMP PATELLA GENESIS 29 OVAL (Orthopedic Implant) ×3 IMPLANT
COMPONENT FEM OXINIUM LT SZ4 (Knees) ×3 IMPLANT
COMPONENT PTLLA GENS 29 OVAL (Orthopedic Implant) ×1 IMPLANT
CONT SPEC 4OZ STER OR WHT (MISCELLANEOUS) ×2
CONTAINER SPEC 2.5X3XGRAD LEK (MISCELLANEOUS) ×1 IMPLANT
COOLER POLAR GLACIER W/PUMP (MISCELLANEOUS) ×3 IMPLANT
COVER WAND RF STERILE (DRAPES) ×3 IMPLANT
CUFF TOURN 30 STER DUAL PORT (MISCELLANEOUS) ×3 IMPLANT
DRAPE INCISE IOBAN 66X60 STRL (DRAPES) ×3 IMPLANT
DRAPE SHEET LG 3/4 BI-LAMINATE (DRAPES) ×3 IMPLANT
ELECT REM PT RETURN 9FT ADLT (ELECTROSURGICAL) ×3
ELECTRODE REM PT RTRN 9FT ADLT (ELECTROSURGICAL) ×1 IMPLANT
GAUZE PETRO XEROFOAM 1X8 (MISCELLANEOUS) ×3 IMPLANT
GAUZE SPONGE 4X4 12PLY STRL (GAUZE/BANDAGES/DRESSINGS) ×3 IMPLANT
GLOVE BIO SURGEON STRL SZ8 (GLOVE) ×3 IMPLANT
GLOVE BIOGEL PI IND STRL 8.5 (GLOVE) ×1 IMPLANT
GLOVE BIOGEL PI INDICATOR 8.5 (GLOVE) ×2
GLOVE INDICATOR 8.0 STRL GRN (GLOVE) ×3 IMPLANT
GLOVE SURG ORTHO 8.0 STRL STRW (GLOVE) ×9 IMPLANT
GOWN STRL REUS W/ TWL LRG LVL3 (GOWN DISPOSABLE) ×1 IMPLANT
GOWN STRL REUS W/ TWL XL LVL3 (GOWN DISPOSABLE) ×1 IMPLANT
GOWN STRL REUS W/TWL LRG LVL3 (GOWN DISPOSABLE) ×2
GOWN STRL REUS W/TWL XL LVL3 (GOWN DISPOSABLE) ×2
HOOD PEEL AWAY FLYTE STAYCOOL (MISCELLANEOUS) ×9 IMPLANT
INSERT ARTI HI FLEX 13 SZ 1-2 (Insert) ×3 IMPLANT
IV NS 1000ML (IV SOLUTION) ×2
IV NS 1000ML BAXH (IV SOLUTION) ×1 IMPLANT
KIT TURNOVER KIT A (KITS) ×3 IMPLANT
MAT ABSORB  FLUID 56X50 GRAY (MISCELLANEOUS) ×2
MAT ABSORB FLUID 56X50 GRAY (MISCELLANEOUS) ×1 IMPLANT
NDL SAFETY ECLIPSE 18X1.5 (NEEDLE) ×1 IMPLANT
NEEDLE HYPO 18GX1.5 SHARP (NEEDLE) ×2
NEEDLE SPNL 20GX3.5 QUINCKE YW (NEEDLE) ×3 IMPLANT
NS IRRIG 1000ML POUR BTL (IV SOLUTION) ×3 IMPLANT
PACK TOTAL KNEE (MISCELLANEOUS) ×3 IMPLANT
PAD DE MAYO PRESSURE PROTECT (MISCELLANEOUS) ×3 IMPLANT
PAD WRAPON POLAR KNEE (MISCELLANEOUS) ×1 IMPLANT
PULSAVAC PLUS IRRIG FAN TIP (DISPOSABLE) ×3
STAPLER SKIN PROX 35W (STAPLE) ×3 IMPLANT
SUCTION FRAZIER HANDLE 10FR (MISCELLANEOUS) ×2
SUCTION TUBE FRAZIER 10FR DISP (MISCELLANEOUS) ×1 IMPLANT
SUT DVC 2 QUILL PDO  T11 36X36 (SUTURE) ×2
SUT DVC 2 QUILL PDO T11 36X36 (SUTURE) ×1 IMPLANT
SUT VIC AB 2-0 CT1 18 (SUTURE) ×3 IMPLANT
SUT VIC AB 2-0 CT1 27 (SUTURE)
SUT VIC AB 2-0 CT1 TAPERPNT 27 (SUTURE) IMPLANT
SUT VIC AB PLUS 45CM 1-MO-4 (SUTURE) ×3 IMPLANT
SYR 30ML LL (SYRINGE) ×9 IMPLANT
TIP FAN IRRIG PULSAVAC PLUS (DISPOSABLE) ×1 IMPLANT
WRAPON POLAR PAD KNEE (MISCELLANEOUS) ×3

## 2017-11-20 NOTE — Anesthesia Procedure Notes (Signed)
Performed by: Rusti Arizmendi, CRNA Pre-anesthesia Checklist: Patient identified, Emergency Drugs available, Suction available, Patient being monitored and Timeout performed Oxygen Delivery Method: Nasal cannula       

## 2017-11-20 NOTE — Plan of Care (Signed)

## 2017-11-20 NOTE — Progress Notes (Signed)
Patient is yelling out and crying that she "wants to go home" pain 10/10, morphine given w/o relief. Family at bedside. Attempting to contact md for orders.

## 2017-11-20 NOTE — Transfer of Care (Signed)
Immediate Anesthesia Transfer of Care Note  Patient: Katie Carter  Procedure(s) Performed: TOTAL KNEE ARTHROPLASTY hardware removal (Left )  Patient Location: PACU  Anesthesia Type:Spinal  Level of Consciousness: awake, alert  and responds to stimulation  Airway & Oxygen Therapy: Patient Spontanous Breathing and Patient connected to nasal cannula oxygen  Post-op Assessment: Report given to RN and Post -op Vital signs reviewed and stable  Post vital signs: Reviewed and stable  Last Vitals:  Vitals Value Taken Time  BP 117/56 11/20/2017 10:08 AM  Temp    Pulse 73 11/20/2017 10:08 AM  Resp 15 11/20/2017 10:08 AM  SpO2 95 % 11/20/2017 10:08 AM    Last Pain:  Vitals:   11/20/17 0612  TempSrc: Tympanic  PainSc: 0-No pain         Complications: No apparent anesthesia complications

## 2017-11-20 NOTE — H&P (Signed)
The patient has been re-examined, and the chart reviewed, and there have been no interval changes to the documented history and physical.  Plan a left total knee replacement and removal of hardware today.  Anesthesia is consulted regarding a peripheral nerve block for post-operative pain.  The risks, benefits, and alternatives have been discussed at length, and the patient is willing to proceed.

## 2017-11-20 NOTE — Op Note (Signed)
DATE OF SURGERY:  11/20/2017 TIME: 10:07 AM  PATIENT NAME:  Katie Carter   AGE: 72 y.o.    PRE-OPERATIVE DIAGNOSIS:  OSTEOARTHRITIS OF LEFT KNEE  POST-OPERATIVE DIAGNOSIS:  Same  PROCEDURE:  Procedure(s): TOTAL KNEE ARTHROPLASTY and hardware removal, left knee  SURGEON:  Lovell Sheehan, MD   ASSISTANT:  [name], PA-C  OPERATIVE IMPLANTS: Tamala [employer]hew, Cruciate Retaining Oxinium Femoral component size  4, Fixed Bearing Tray size 2, Patella polyethylene 3-peg oval button size 29 mm, with a 13 mm HighFlex insert. 40 cc of cancellous bone chips   PREOPERATIVE INDICATIONS:  Katie Carter is an 73 y.o. female who has a diagnosis of OSTEOARTHRITIS OF LEFT KNEE and elected for a left total knee arthroplasty and removal of hardware after failing nonoperative treatment, including activity modification, pain medication, physical therapy and injections who has significant impairment of their activities of daily living.  Radiographs have demonstrated tricompartmental osteoarthritis joint space narrowing, osteophytes, subchondral sclerosis and cyst formation.  The risks, benefits, and alternatives were discussed at length including but not limited to the risks of infection, bleeding, nerve or blood vessel injury, knee stiffness, fracture, dislocation, loosening or failure of the hardware and the need for further surgery. Medical risks include but not limited to DVT and pulmonary embolism, myocardial infarction, stroke, pneumonia, respiratory failure and death. I discussed these risks with the patient in my office prior to the date of surgery. They understood these risks and were willing to proceed.  OPERATIVE FINDINGS AND UNIQUE ASPECTS OF THE CASE:  All three compartments with advanced and severe degenerative changes, large osteophytes and an abundance of synovial fluid. Significant deformity was also noted. A decision was made to proceed with total knee arthroplasty.   OPERATIVE  DESCRIPTION:  The patient was brought to the operative room and placed in a supine position after undergoing placement of a general anesthetic. IV antibiotics were given. Patient received tranexamic acid. The lower extremity was prepped and draped in the usual sterile fashion.  A time out was performed to verify the patient's name, date of birth, medical record number, correct site of surgery and correct procedure to be performed. The timeout was also used to confirm the patient received antibiotics and that appropriate instruments, implants and radiographs studies were available in the room.   First the prior lateral incision was utilized to expose and remove two lateral staples. They were passed from the field without difficulty.  The leg was elevated and exsanguinated with an Esmarch and the tourniquet was inflated to 225 mmHg.  A midline incision was made over the left knee.. A medial parapatellar arthrotomy was then made and the patella subluxed laterally and the knee was brought into 90 of flexion. Hoffa's fat pad along with the anterior cruciate ligament was resected and the medial joint line was exposed.  Attention was then turned to preparation of the patella. The thickness of the patella was measured with a caliper, the diameter measured with the patella templates.  The patella resection was then made with an oscillating saw using the patella cutting guide.  The 29 mm button fit appropriately.  3 peg holes for the patella component were then drilled.  The extramedullary tibial cutting guide was then placed using the anterior tibial crest and second ray of the foot as a reference.  The tibial cutting guide was adjusted to allow for appropriate posterior slope.  The tibial cutting block was pinned into position. The slotted stylus was used to  measure the proximal tibial resection of 9 mm off the high lateral side. Care was taken during the tibial resection to protect the medial and collateral  ligaments.  The resected tibial bone was removed.  The distal femur was resected using the Visionaire cutting guide.  Care was taken to protect the collateral ligaments during distal femoral resection.  The distal femoral resection was performed with an oscillating saw. The femoral cutting guide was then removed. A large medial condyle cyst was encountered. This was curretted and packed with cancellous bone graft. Extension gap was measured with a 13 mm spacer block and alignment and extension was confirmed using a long alignment rod. The femur was sized to be a 4. Rotation of the referencing guide was checked with the epicondylar axis and Whitesides line. Then the 4-in-1 cutting jig was then applied to the distal femur. A stylus was used to confirm that the anterior femur would not be notched.   Then the anterior, posterior and chamfer femoral cuts were then made with an oscillating saw.  The knee was distracted and all posterior osteophytes were removed.  The flexion gap was then measured with a flexion spacer block and long alignment rod and was found to be symmetric with the extension gap and perpendicular to mechanical axis of the tibia.  The proximal tibia plateau was then sized with trial trays. The best coverage was achieved with a size 2. This tibial tray was then pinned into position. The proximal tibia was then prepared with the keel punch.  After tibial preparation was completed, all trial components were inserted with polyethylene trials. The knee achieved full extension and flexed to 120 degrees. Ligament were stable to varus and valgus at full extension as well as 30, 60 and 90 degrees of flexion.   The trials were then placed. Knee was taken through a full range of motion and deemed to be stable with the trial components. All trial components were then removed.  The joint was copiously irrigated with pulse lavage.  The final total knee arthroplasty components were then cemented into place.  The knee was held in extension while cement was allowed to cure.The knee was taken through a range of motion and the patella tracked well and the knee was again irrigated copiously.  The knee capsule was then injected with Exparel.  The medial arthrotomy was closed with #1 Vicryl and #2 Quill. The subcutaneous tissue closed with  2-0 vicryl, and skin approximated with staples.  A dry sterile and compressive dressing was applied.  A Polar Care was applied to the operative knee.  The patient was awakened and brought to the PACU in stable and satisfactory condition.  All sharp, lap and instrument counts were correct at the conclusion the case. I spoke with the patient's family in the postop consultation room to let them know the case had been performed without complication and the patient was stable in recovery room.   Total tourniquet time was 66 minutes.

## 2017-11-20 NOTE — Progress Notes (Signed)
Patient given 1mg  dilaudid at 1525.  Patient now resting comfortable. Family at bedside. Cont to monitor

## 2017-11-20 NOTE — Anesthesia Post-op Follow-up Note (Signed)
Anesthesia QCDR form completed.        

## 2017-11-20 NOTE — Evaluation (Signed)
Physical Therapy Evaluation Patient Details Name: Katie Carter MRN: [id number] DOB: Oct 20, 1945 Today's Date: 11/20/2017   History of Present Illness  Patient is 72 yo female s/p L TKA WBAT. PMH of SOB, COPD, current smoker, GERD.  Clinical Impression  Patient sitting EOB with 2 nursing staff when PT entered room, very anxious to mobilize with significant levels of pain. Quick assessment of sensation, pt reported being able to feel PT touch, able to wiggle toes and DF/PF LLE. Sit <> stand with minAx2, able to take shuffling, short steps to chair ~47ft with RW decreased weight bearing on LLE max verbal cues for AD management, sequencing of movements. Patient needed modAx2 for descent into chair, poor eccentric control noted. Patient with significant pain and distressed at end of mobilization, nursing staff in room and aware of patient requests for pain medication. PT spoke with family to acquire PLOF and home environment. Previously pt independent in ADLs/IADLs, does not ambulate with AD per family/significant other, lives in one story home with multiple STE with significant other who will be available 24/7 to assist as needed. Overall the patient demonstrated significant changes from PLOF, and would benefit from skilled PT to address these changes. Current recommendation is STR due to level of assistance needed.      Follow Up Recommendations SNF    Equipment Recommendations  Rolling walker with 5" wheels    Recommendations for Other Services       Precautions / Restrictions Precautions Precautions: Knee;Fall Precaution Booklet Issued: No Restrictions Weight Bearing Restrictions: Yes LLE Weight Bearing: Weight bearing as tolerated      Mobility  Bed Mobility               General bed mobility comments: Patient sitting EOB at start of session  Transfers Overall transfer level: Needs assistance Equipment used: Rolling walker (2 wheeled) Transfers: Sit to/from Stand Sit to  Stand: +2 physical assistance;Min assist;From elevated surface            Ambulation/Gait Ambulation/Gait assistance: Min assist;+2 physical assistance Gait Distance (Feet): 2 Feet Assistive device: Rolling walker (2 wheeled)   Gait velocity: decreased   General Gait Details: Antalgic, decreased weight bearing on LLE. step to gait pattern, shuffling step.   Stairs            Wheelchair Mobility    Modified Rankin (Stroke Patients Only)       Balance Overall balance assessment: Needs assistance Sitting-balance support: Feet supported;Bilateral upper extremity supported Sitting balance-Leahy Scale: Poor       Standing balance-Leahy Scale: Poor                               Pertinent Vitals/Pain Pain Assessment: Faces Faces Pain Scale: Hurts worst Pain Intervention(s): Limited activity within patient's tolerance;Patient requesting pain meds-RN notified;Monitored during session;RN gave pain meds during session;Repositioned;Ice applied    Home Living Family/patient expects to be discharged to:: Private residence Living Arrangements: Spouse/significant other Available Help at Discharge: Family Type of Home: House Home Access: Stairs to enter Entrance Stairs-Rails: None Entrance Stairs-Number of Steps: 3-4 Home Layout: One level Home Equipment: Environmental consultant - 2 wheels Additional Comments: Patient with high levels of pain during PLOF information, provided by family/significant other    Prior Function Level of Independence: Independent               Hand Dominance        Extremity/Trunk Assessment   Upper  Extremity Assessment Upper Extremity Assessment: Generalized weakness    Lower Extremity Assessment Lower Extremity Assessment: Generalized weakness       Communication   Communication: No difficulties  Cognition Arousal/Alertness: Awake/alert Behavior During Therapy: Agitated;Impulsive Overall Cognitive Status: Difficult to assess                                         General Comments      Exercises     Assessment/Plan    PT Assessment Patient needs continued PT services  PT Problem List Decreased strength;Decreased range of motion;Decreased activity tolerance;Decreased safety awareness;Decreased balance;Decreased knowledge of precautions;Pain;Decreased mobility;Decreased knowledge of use of DME       PT Treatment Interventions DME instruction;Balance training;Gait training;Neuromuscular re-education;Stair training;Functional mobility training;Patient/family education;Therapeutic activities;Therapeutic exercise    PT Goals (Current goals can be found in the Care Plan section)  Acute Rehab PT Goals Patient Stated Goal: Patient wants her pain to be controlled PT Goal Formulation: With patient Time For Goal Achievement: 12/04/17 Potential to Achieve Goals: Good    Frequency BID   Barriers to discharge Inaccessible home environment      Co-evaluation               AM-PAC PT "6 Clicks" Daily Activity  Outcome Measure Difficulty turning over in bed (including adjusting bedclothes, sheets and blankets)?: Unable Difficulty moving from lying on back to sitting on the side of the bed? : Unable Difficulty sitting down on and standing up from a chair with arms (e.g., wheelchair, bedside commode, etc,.)?: Unable Help needed moving to and from a bed to chair (including a wheelchair)?: A Lot Help needed walking in hospital room?: A Lot Help needed climbing 3-5 steps with a railing? : Total 6 Click Score: 8    End of Session Equipment Utilized During Treatment: Gait belt Activity Tolerance: Patient limited by pain;Treatment limited secondary to agitation Patient left: with chair alarm set;in chair;with family/visitor present;with nursing/sitter in room;with call bell/phone within reach;with SCD's reapplied Nurse Communication: Mobility status;Patient requests pain meds PT Visit Diagnosis:  Unsteadiness on feet (R26.81);Difficulty in walking, not elsewhere classified (R26.2);Muscle weakness (generalized) (M62.81);Pain Pain - Right/Left: Left Pain - part of body: Knee    Time: 6387-5643 PT Time Calculation (min) (ACUTE ONLY): 20 min   Charges:   PT Evaluation $PT Eval Low Complexity: 1 Low         Lieutenant Diego PT, DPT 4:49 PM,11/20/17 [phone]

## 2017-11-20 NOTE — Anesthesia Preprocedure Evaluation (Signed)
Anesthesia Evaluation  Patient identified by MRN, date of birth, ID band Patient awake    Reviewed: Allergy & Precautions, NPO status , Patient's Chart, lab work & pertinent test results  Airway Mallampati: III       Dental   Pulmonary shortness of breath, COPD,  COPD inhaler, Current Smoker,    Pulmonary exam normal        Cardiovascular negative cardio ROS Normal cardiovascular exam     Neuro/Psych negative neurological ROS  negative psych ROS   GI/Hepatic Neg liver ROS, GERD  ,  Endo/Other  Hypothyroidism   Renal/GU negative Renal ROS  negative genitourinary   Musculoskeletal  (+) Arthritis , Osteoarthritis,    Abdominal Normal abdominal exam  (+)   Peds negative pediatric ROS (+)  Hematology   Anesthesia Other Findings   Reproductive/Obstetrics                             Anesthesia Physical Anesthesia Plan  ASA: III  Anesthesia Plan: Spinal   Post-op Pain Management:  Regional for Post-op pain   Induction: Intravenous  PONV Risk Score and Plan:   Airway Management Planned: Nasal Cannula  Additional Equipment:   Intra-op Plan:   Post-operative Plan:   Informed Consent: I have reviewed the patients History and Physical, chart, labs and discussed the procedure including the risks, benefits and alternatives for the proposed anesthesia with the patient or authorized representative who has indicated his/her understanding and acceptance.   Dental advisory given  Plan Discussed with: CRNA and Surgeon  Anesthesia Plan Comments:         Anesthesia Quick Evaluation

## 2017-11-21 ENCOUNTER — Encounter: Payer: Self-pay | Admitting: Orthopedic Surgery

## 2017-11-21 LAB — CBC
HEMATOCRIT: 39.7 % (ref 36.0–46.0)
HEMOGLOBIN: 12.8 g/dL (ref 12.0–15.0)
MCH: 29.2 pg (ref 26.0–34.0)
MCHC: 32.2 g/dL (ref 30.0–36.0)
MCV: 90.4 fL (ref 80.0–100.0)
NRBC: 0 % (ref 0.0–0.2)
Platelets: 244 10*3/uL (ref 150–400)
RBC: 4.39 MIL/uL (ref 3.87–5.11)
RDW: 13.8 % (ref 11.5–15.5)
WBC: 13 10*3/uL — ABNORMAL HIGH (ref 4.0–10.5)

## 2017-11-21 LAB — BASIC METABOLIC PANEL
ANION GAP: 4 — AB (ref 5–15)
BUN: 11 mg/dL (ref 8–23)
CHLORIDE: 106 mmol/L (ref 98–111)
CO2: 26 mmol/L (ref 22–32)
Calcium: 8.3 mg/dL — ABNORMAL LOW (ref 8.9–10.3)
Creatinine, Ser: 0.61 mg/dL (ref 0.44–1.00)
GFR calc non Af Amer: >60 mL/min (ref >60)
Glucose, Bld: 88 mg/dL (ref 70–99)
Potassium: 4 mmol/L (ref 3.5–5.1)
SODIUM: 136 mmol/L (ref 135–145)

## 2017-11-21 NOTE — Care Management (Signed)
PT is recommending SNF; CSW following. RNCM will follow along.

## 2017-11-21 NOTE — Clinical Social Work Note (Signed)
Clinical Social Work Assessment  Patient Details  Name: Katie Carter MRN: [id number] Date of Birth: 03/06/1945  Date of referral:  11/21/17               Reason for consult:  Facility Placement                Permission sought to share information with:  Facility Contact Representative Permission granted to share information::  Yes, Verbal Permission Granted  Name::      Skilled Nursing Facility   Agency::   [place]   Relationship::     Contact Information:     Housing/Transportation Living arrangements for the past 2 months:  Single Family Home Source of Information:  Patient, Other (Comment Required)(Significant other [name] ) Patient Interpreter Needed:  None Criminal Activity/Legal Involvement Pertinent to Current Situation/Hospitalization:  No - Comment as needed Significant Relationships:  Significant Other Lives with:  Significant Other Do you feel safe going back to the place where you live?  Yes Need for family participation in patient care:  Yes (Comment)  Care giving concerns:  Patient lives in [place] with her significant other [name] [phone].    Social Worker assessment / plan:  Clinical Social Worker (CSW) received SNF consult. PT is recommending SNF. CSW met with patient and her significant other [name] was at bedside. Patient was alert and oriented X3 and was laying in the bed. CSW introduced self and explained role of CSW department. CSW explained SNF process and that [employer] will have to approve it. Patient is agreeable to SNF search in [place]. FL2 complete and faxed out.   CSW presented bed offers to patient and [name], they chose [facility]. Per [facility] liaison she will start [employer] SNF authorization today. CSW will continue to follow and assist as needed.   Employment status:  Disabled (Comment on whether or not currently receiving Disability) Insurance information:  Managed Medicare PT Recommendations:  Skilled Nursing Facility Information /  Referral to community resources:  Skilled Nursing Facility  Patient/Family's Response to care:  Patient chose [facility].   Patient/Family's Understanding of and Emotional Response to Diagnosis, Current Treatment, and Prognosis:  Patient was very pleasant and thanked CSW for assistance.   Emotional Assessment Appearance:  Appears stated age Attitude/Demeanor/Rapport:    Affect (typically observed):  Accepting, Adaptable, Pleasant Orientation:  Oriented to Self, Oriented to Place, Oriented to  Time, Oriented to Situation Alcohol / Substance use:  Not Applicable Psych involvement (Current and /or in the community):  No (Comment)  Discharge Needs  Concerns to be addressed:  Discharge Planning Concerns Readmission within the last 30 days:  No Current discharge risk:  Dependent with Mobility Barriers to Discharge:  Continued Medical Work up   ,  M, LCSW 11/21/2017, 4:17 PM  

## 2017-11-21 NOTE — Clinical Social Work Placement (Signed)
   CLINICAL SOCIAL WORK PLACEMENT  NOTE  Date:  11/21/2017  Patient Details  Name: Katie Carter MRN: [id number] Date of Birth: 1945/07/06  Clinical Social Work is seeking post-discharge placement for this patient at the [place] level of care (*CSW will initial, date and re-position this form in  chart as items are completed):  Yes   Patient/family provided with [facility] Work Department's list of facilities offering this level of care within the geographic area requested by the patient (or if unable, by the patient's family).  Yes   Patient/family informed of their freedom to choose among providers that offer the needed level of care, that participate in Medicare, Medicaid or managed care program needed by the patient, have an available bed and are willing to accept the patient.  Yes   Patient/family informed of [facility]'s ownership interest in [facility] and [facility], as well as of the fact that they are under no obligation to receive care at these facilities.  PASRR submitted to EDS on 11/21/17     PASRR number received on 11/21/17     Existing PASRR number confirmed on       FL2 transmitted to all facilities in geographic area requested by pt/family on 11/21/17     FL2 transmitted to all facilities within larger geographic area on       Patient informed that his/her managed care company has contracts with or will negotiate with certain facilities, including the following:        Yes   Patient/family informed of bed offers received.  Patient chooses bed at ([facility] )     Physician recommends and patient chooses bed at      Patient to be transferred to   on  .  Patient to be transferred to facility by       Patient family notified on   of transfer.  Name of family member notified:        PHYSICIAN       Additional Comment:    _______________________________________________ Katie Carter, Katie Beets, LCSW 11/21/2017, 4:16  PM

## 2017-11-21 NOTE — Progress Notes (Signed)
Physical Therapy Treatment Patient Details Name: Katie Carter MRN: [id number] DOB: 1945-03-07 Today's Date: 11/21/2017    History of Present Illness 72 yo female s/p L TKA WBAT. PMH of SOB, COPD, current smoker, GERD.    PT Comments    Pt continues to be fatigued and have considerable pain with all tasks involving the L LE, however she did manage to improve mobility, transfers and increase ambulation distance (and minimally speed/quality).  She is still quite limited functionally and requires assist with most acts, shows poor quad control/strength (some buckling in [place]).  Pt making slow improvement, will still require STR on discharge.   Follow Up Recommendations  SNF     Equipment Recommendations  Rolling walker with 5" wheels    Recommendations for Other Services       Precautions / Restrictions Precautions Precautions: Knee;Fall Precaution Booklet Issued: No Restrictions LLE Weight Bearing: Weight bearing as tolerated    Mobility  Bed Mobility Overal bed mobility: Needs Assistance Bed Mobility: Supine to Sit;Sit to Supine     Supine to sit: Min guard Sit to supine: Mod assist   General bed mobility comments: Pt did better with getting to EOB needing only cuing and extra time, unable to get back to sitting w/o signficant assist to raise LEs  Transfers Overall transfer level: Needs assistance Equipment used: Rolling walker (2 wheeled) Transfers: Sit to/from Stand Sit to Stand: Min assist         General transfer comment: Pt needed light assist to get up from bed, CGA only from recliner (slightly higher with b/l arm rests).  She showed great effort and though she needed a lot of cuing, encouragement, explanation she is showing improvment.   Ambulation/Gait Ambulation/Gait assistance: Min assist Gait Distance (Feet): 35 Feet Assistive device: Rolling walker (2 wheeled)       General Gait Details: Pt again starting out over slow and hesitant.  She was  better able to maintain somewhat of a cadence with a lot of cuing and some minimal encouragement with walker momentum.  Improving but still very slow, limited and with some occasional L knee buckling.     Stairs             Wheelchair Mobility    Modified Rankin (Stroke Patients Only)       Balance Overall balance assessment: Needs assistance   Sitting balance-Leahy Scale: Fair       Standing balance-Leahy Scale: Fair                              Cognition Arousal/Alertness: Awake/alert Behavior During Therapy: WFL for tasks assessed/performed Overall Cognitive Status: History of cognitive impairments - at baseline                                        Exercises Total Joint Exercises Ankle Circles/Pumps: Strengthening;10 reps Quad Sets: Strengthening;10 reps Gluteal Sets: Strengthening;10 reps Heel Slides: 10 reps;AROM Hip ABduction/ADduction: Strengthening;10 reps Knee Flexion: PROM;5 reps    General Comments        Pertinent Vitals/Pain Pain Score: 6 (increases with ROM/activity)    Home Living                      Prior Function            PT Goals (current goals  can now be found in the care plan section) Progress towards PT goals: Progressing toward goals    Frequency    BID      PT Plan Current plan remains appropriate    Co-evaluation              AM-PAC PT "6 Clicks" Daily Activity  Outcome Measure  Difficulty turning over in bed (including adjusting bedclothes, sheets and blankets)?: A Lot Difficulty moving from lying on back to sitting on the side of the bed? : Unable Difficulty sitting down on and standing up from a chair with arms (e.g., wheelchair, bedside commode, etc,.)?: Unable Help needed moving to and from a bed to chair (including a wheelchair)?: A Little Help needed walking in hospital room?: A Little Help needed climbing 3-5 steps with a railing? : A Lot 6 Click Score:  12    End of Session Equipment Utilized During Treatment: Gait belt Activity Tolerance: Patient limited by pain;Treatment limited secondary to agitation Patient left: with chair alarm set;with family/visitor present;with call bell/phone within reach Nurse Communication: Mobility status PT Visit Diagnosis: Unsteadiness on feet (R26.81);Difficulty in walking, not elsewhere classified (R26.2);Muscle weakness (generalized) (M62.81);Pain Pain - Right/Left: Left Pain - part of body: Knee     Time: 1448-1856 PT Time Calculation (min) (ACUTE ONLY): 39 min  Charges:  $Gait Training: 8-22 mins $Therapeutic Exercise: 8-22 mins $Therapeutic Activity: 8-22 mins                     Kreg Shropshire, DPT 11/21/2017, 5:05 PM

## 2017-11-21 NOTE — NC FL2 (Signed)
[place] LEVEL OF CARE SCREENING TOOL     IDENTIFICATION  Patient Name: Katie Carter Birthdate: 14-Nov-1945 Sex: female Admission Date (Current Location): 11/20/2017  [facility] [place] Number:  Selena Lesser ([phone] [facility]) Facility and Address:  [facility], [address], [place], [place] [address]      Provider Number: [id number]  Attending Physician Name and Address:  Lovell Sheehan, MD  Relative Name and Phone Number:       Current Level of Care: Hospital Recommended Level of Care: [place] Prior Approval Number:    Date Approved/Denied:   PASRR Number: [id number])  Discharge Plan: SNF    Current Diagnoses: Patient Active Problem List   Diagnosis Date Noted  . Osteoarthritis of left knee 11/20/2017  . Hypercholesteremia 12/10/2014  . CAFL (chronic airflow limitation) ([place]) 02/24/2009  . LBP (low back pain) 05/06/2006  . Acid reflux 10/15/2005  . Adult hypothyroidism 10/15/2005  . Primary localized osteoarthrosis, lower leg 10/15/2005  . Tobacco use 10/15/2005    Orientation RESPIRATION BLADDER Height & Weight     Self, Time, Situation, Place  O2(1.5 Liters Oxygen. ) Continent Weight:   Height:     BEHAVIORAL SYMPTOMS/MOOD NEUROLOGICAL BOWEL NUTRITION STATUS      Continent Diet(Diet: Regular )  AMBULATORY STATUS COMMUNICATION OF NEEDS Skin   Extensive Assist Verbally Surgical wounds(Incision: Left Knee. )                       Personal Care Assistance Level of Assistance  Bathing, Feeding, Dressing Bathing Assistance: Limited assistance Feeding assistance: Independent Dressing Assistance: Limited assistance     Functional Limitations Info  Sight, Hearing, Speech Sight Info: Adequate Hearing Info: Adequate Speech Info: Adequate    SPECIAL CARE FACTORS FREQUENCY  PT (By licensed PT), OT (By licensed OT)     PT Frequency: (5) OT Frequency: (5)            Contractures       Additional Factors Info  Code Status, Allergies Code Status Info: (Full Code. ) Allergies Info: (Meloxicam, Tramadol)           Current Medications (11/21/2017):  This is the current hospital active medication list Current Facility-Administered Medications  Medication Dose Route Frequency Provider Last Rate Last Dose  . acetaminophen (TYLENOL) tablet 325-650 mg  325-650 mg Oral Q6H PRN Lovell Sheehan, MD      . acetaminophen (TYLENOL) tablet 500 mg  500 mg Oral Q6H Lovell Sheehan, MD   500 mg at 11/21/17 0008  . albuterol (PROVENTIL) (2.5 MG/3ML) 0.083% nebulizer solution 2.5 mg  2.5 mg Nebulization Q6H PRN Lovell Sheehan, MD      . aspirin chewable tablet 81 mg  81 mg Oral BID Lovell Sheehan, MD   81 mg at 11/20/17 2052  . bisacodyl (DULCOLAX) suppository 10 mg  10 mg Rectal Daily PRN Lovell Sheehan, MD      . diazepam (VALIUM) tablet 5 mg  5 mg Oral Q8H PRN Lovell Sheehan, MD   5 mg at 11/20/17 1721  . docusate sodium (COLACE) capsule 100 mg  100 mg Oral BID Lovell Sheehan, MD   100 mg at 11/20/17 2052  . gabapentin (NEURONTIN) capsule 300 mg  300 mg Oral TID Lovell Sheehan, MD   300 mg at 11/20/17 2052  . HYDROmorphone (DILAUDID) injection 1 mg  1 mg Intravenous Q2H PRN Lovell Sheehan, MD  1 mg at 11/21/17 0644  . lactated ringers infusion   Intravenous Continuous Lovell Sheehan, MD 75 mL/hr at 11/21/17 0349    . magnesium citrate solution 1 Bottle  1 Bottle Oral Once PRN Lovell Sheehan, MD      . magnesium hydroxide (MILK OF MAGNESIA) suspension 30 mL  30 mL Oral Daily PRN Lovell Sheehan, MD      . menthol-cetylpyridinium (CEPACOL) lozenge 3 mg  1 lozenge Oral PRN Lovell Sheehan, MD       Or  . phenol (CHLORASEPTIC) mouth spray 1 spray  1 spray Mouth/Throat PRN Lovell Sheehan, MD      . metoCLOPramide (REGLAN) tablet 5-10 mg  5-10 mg Oral Q8H PRN Lovell Sheehan, MD       Or  . metoCLOPramide (REGLAN) injection 5-10 mg  5-10 mg Intravenous Q8H PRN Lovell Sheehan,  MD      . nicotine (NICODERM CQ - dosed in mg/24 hours) patch 21 mg  21 mg Transdermal Daily Lovell Sheehan, MD      . ondansetron [facility]) tablet 4 mg  4 mg Oral Q6H PRN Lovell Sheehan, MD       Or  . ondansetron [facility]) injection 4 mg  4 mg Intravenous Q6H PRN Lovell Sheehan, MD   4 mg at 11/20/17 1355  . oxyCODONE (Oxy IR/ROXICODONE) immediate release tablet 5-15 mg  5-15 mg Oral Q4H PRN Lovell Sheehan, MD   10 mg at 11/21/17 4854     Discharge Medications: Please see discharge summary for a list of discharge medications.  Relevant Imaging Results:  Relevant Lab Results:   Additional Information (SSN: [id number])  Noha Milberger, Veronia Beets, LCSW

## 2017-11-21 NOTE — Progress Notes (Signed)
Physical Therapy Treatment Patient Details Name: Katie Carter MRN: [id number] DOB: 1945/01/24 Today's Date: 11/21/2017    History of Present Illness 72 yo female s/p L TKA WBAT. PMH of SOB, COPD, current smoker, GERD.    PT Comments    Pt continues to c/o considerable pain, but tolerated all aspects of PT much more readily this session and was able to participate well with exercises and was able to manage a slow but safe bout of ambulation into the hallway.  Her knee is stiff and she lacks TKE (even with overpressure she is still lacking ~5 degrees from full extension).  She had a very difficult time last night with pain, but did as well as she likely could have in this regard during this AM session. Pt still very functionally limited and STR is still most appropriate despite significant improvement this session.    Follow Up Recommendations  SNF     Equipment Recommendations  Rolling walker with 5" wheels    Recommendations for Other Services       Precautions / Restrictions Precautions Precautions: Knee;Fall Restrictions Weight Bearing Restrictions: Yes LLE Weight Bearing: Weight bearing as tolerated    Mobility  Bed Mobility Overal bed mobility: Needs Assistance Bed Mobility: Supine to Sit;Sit to Supine     Supine to sit: Mod assist     General bed mobility comments: Pt showed some effort with getting to EOB, needed assist with torso and LEs  Transfers Overall transfer level: Needs assistance Equipment used: Rolling walker (2 wheeled) Transfers: Sit to/from Stand Sit to Stand: Min assist         General transfer comment: Cues for set up, UE use and generally to insure appropriate effort.    Ambulation/Gait Ambulation/Gait assistance: Min assist Gait Distance (Feet): 25 Feet Assistive device: Rolling walker (2 wheeled)       General Gait Details: Pt with very slow, guarded, cautious gait but overall actually did better than expected.  Still very small  steppage with heavy reliance on the UE/walker.    Stairs             Wheelchair Mobility    Modified Rankin (Stroke Patients Only)       Balance Overall balance assessment: Needs assistance Sitting-balance support: Feet supported;Bilateral upper extremity supported Sitting balance-Leahy Scale: Fair Sitting balance - Comments: Pt was able to maintain sitting EOB w/o direct assist.     Standing balance-Leahy Scale: Fair Standing balance comment: Using walker pt was able to maintain static balance relatively well, showed poor tolerance with weight shifting                             Cognition Arousal/Alertness: Awake/alert Behavior During Therapy: Impulsive Overall Cognitive Status: History of cognitive impairments - at baseline                                        Exercises Total Joint Exercises Ankle Circles/Pumps: Strengthening;10 reps Quad Sets: Strengthening;10 reps Heel Slides: 10 reps;AROM Hip ABduction/ADduction: Strengthening;10 reps Straight Leg Raises: AAROM;5 reps Knee Flexion: PROM;5 reps Goniometric ROM: 4-73    General Comments        Pertinent Vitals/Pain Pain Assessment: 0-10 Pain Score: 8     Home Living  Prior Function            PT Goals (current goals can now be found in the care plan section) Progress towards PT goals: Progressing toward goals    Frequency    BID      PT Plan Current plan remains appropriate    Co-evaluation              AM-PAC PT "6 Clicks" Daily Activity  Outcome Measure  Difficulty turning over in bed (including adjusting bedclothes, sheets and blankets)?: A Lot Difficulty moving from lying on back to sitting on the side of the bed? : Unable Difficulty sitting down on and standing up from a chair with arms (e.g., wheelchair, bedside commode, etc,.)?: Unable Help needed moving to and from a bed to chair (including a wheelchair)?: A  Little Help needed walking in hospital room?: A Little Help needed climbing 3-5 steps with a railing? : A Lot 6 Click Score: 12    End of Session Equipment Utilized During Treatment: Gait belt Activity Tolerance: Patient limited by pain;Treatment limited secondary to agitation Patient left: with chair alarm set;with family/visitor present;with call bell/phone within reach Nurse Communication: Mobility status PT Visit Diagnosis: Unsteadiness on feet (R26.81);Difficulty in walking, not elsewhere classified (R26.2);Muscle weakness (generalized) (M62.81);Pain Pain - Right/Left: Left Pain - part of body: Knee     Time: 9629-5284 PT Time Calculation (min) (ACUTE ONLY): 28 min  Charges:  $Gait Training: 8-22 mins $Therapeutic Exercise: 8-22 mins                     Kreg Shropshire, DPT 11/21/2017, 11:43 AM

## 2017-11-21 NOTE — Progress Notes (Signed)
Patient complaining of pain about every three to four hours. Patient moaning, grimacing, and verbalizing discomfort each time she awakens from sleep. Patient confused on and off. Per family has history of dementia.  Patient lethargic, arousable to voice but falling asleep during conversation of and on throughout night. Will monitor

## 2017-11-22 LAB — CBC
HEMATOCRIT: 37.3 % (ref 36.0–46.0)
HEMOGLOBIN: 11.9 g/dL — AB (ref 12.0–15.0)
MCH: 29 pg (ref 26.0–34.0)
MCHC: 31.9 g/dL (ref 30.0–36.0)
MCV: 91 fL (ref 80.0–100.0)
Platelets: 242 10*3/uL (ref 150–400)
RBC: 4.1 MIL/uL (ref 3.87–5.11)
RDW: 13.8 % (ref 11.5–15.5)
WBC: 12.5 10*3/uL — ABNORMAL HIGH (ref 4.0–10.5)
nRBC: 0 % (ref 0.0–0.2)

## 2017-11-22 LAB — SURGICAL PATHOLOGY

## 2017-11-22 MED ORDER — NICOTINE 21 MG/24HR TD PT24: 21.0000 mg | MEDICATED_PATCH | Freq: Every day | TRANSDERMAL | 0 refills | Status: DC

## 2017-11-22 MED ORDER — OXYCODONE HCL 5 MG PO TABS: 5.0000 mg | ORAL_TABLET | ORAL | 0 refills | Status: DC | PRN

## 2017-11-22 MED ORDER — DOCUSATE SODIUM 100 MG PO CAPS: 100.0000 mg | ORAL_CAPSULE | Freq: Two times a day (BID) | ORAL | 0 refills | Status: DC

## 2017-11-22 MED ORDER — DIAZEPAM 5 MG PO TABS: 5.0000 mg | ORAL_TABLET | Freq: Three times a day (TID) | ORAL | 0 refills | Status: DC | PRN

## 2017-11-22 NOTE — Progress Notes (Addendum)
SPORTS MEDICINE AND JOINT REPLACEMENT  Lara Mulch, MD   [name], PA-C [place], [place], [place]  [address]                             [phone]   PROGRESS NOTE  Subjective:  negative for Chest Pain  negative for Shortness of Breath  negative for Nausea/Vomiting   negative for Calf Pain  negative for Bowel Movement   Tolerating Diet: yes         Patient reports pain as 7 on 0-10 scale.    Objective: Vital signs in last 24 hours:    Patient Vitals for the past 24 hrs:  BP Temp Temp src Pulse Resp SpO2  11/22/17 0002 110/90 99.2 F (37.3 C) Oral 92 19 95 %  11/21/17 1604 (!) 123/58 98.3 F (36.8 C) Oral 93 - 97 %  11/21/17 1201 111/60 98.1 F (36.7 C) Oral 86 - 100 %  11/21/17 0806 125/65 98 F (36.7 C) Oral 90 - 95 %    @flow {1959:LAST@   Intake/Output from previous day:   11/14 0701 - 11/15 0700 In: 9702 [P.O.:240; I.V.:1201] Out: -    Intake/Output this shift:   No intake/output data recorded.   Intake/Output      11/14 0701 - 11/15 0700 11/15 0701 - 11/16 0700   P.O. 240    I.V. 1201    IV Piggyback     Total Intake 1441    Urine     Blood     Total Output     Net +1441         Urine Occurrence 5 x       LABORATORY DATA: Recent Labs    11/21/17 0445 11/22/17 0431  WBC 13.0* 12.5*  HGB 12.8 11.9*  HCT 39.7 37.3  PLT 244 242   Recent Labs    11/21/17 0445  NA 136  K 4.0  CL 106  CO2 26  BUN 11  CREATININE 0.61  GLUCOSE 88  CALCIUM 8.3*   Lab Results  Component Value Date   INR 1.01 11/11/2017    Examination:  General appearance: alert, cooperative and no distress  Wound Exam: draining   Drainage:  Scant/small amount Serosanguinous exudate  Motor Exam: EHL and FHL Intact  Sensory Exam: Superficial Peroneal and Deep Peroneal normal   Assessment:    2 Days Post-Op  Procedure(s) (LRB): TOTAL KNEE ARTHROPLASTY hardware removal (Left)  ADDITIONAL DIAGNOSIS:  Active Problems:   Osteoarthritis of  left knee  none   Plan: Physical Therapy as ordered Weight Bearing as Tolerated (WBAT)    DISCHARGE PLAN: Home vs SNF depending on PT      OK for transfer to SNF today if bed available    [name] 11/22/2017, 7:08 AM

## 2017-11-22 NOTE — Progress Notes (Signed)
Physical Therapy Treatment Patient Details Name: Katie Carter MRN: [id number] DOB: 04/04/1945 Today's Date: 11/22/2017    History of Present Illness 72 yo female s/p L TKA WBAT. PMH of SOB, COPD, current smoker, GERD.    PT Comments    Pt more pain limited today (also more awake) and had significant stiffness in L knee with all tasks.  Pt needed a lot of extra encouragement and extra time to perform exercises as she needed constant reminders to calm, breath and try to relax.  Overall she made some gains with L LE strength and quality of ambulation, but ROM and mobility are close to yesterday's limited performance.  Educated pt/family again on appropriate posturing with L knee and trying to insure we do not let it get too stiff mid range.   Follow Up Recommendations  SNF     Equipment Recommendations  Rolling walker with 5" wheels    Recommendations for Other Services       Precautions / Restrictions Precautions Precautions: Knee;Fall Restrictions Weight Bearing Restrictions: Yes LLE Weight Bearing: Weight bearing as tolerated    Mobility  Bed Mobility Overal bed mobility: Needs Assistance Bed Mobility: Supine to Sit     Supine to sit: Min assist     General bed mobility comments: Pt slow to get to sitting, needing a lot of cuing, UE use and light phyiscal assist  Transfers Overall transfer level: Needs assistance Equipment used: Rolling walker (2 wheeled) Transfers: Sit to/from Stand Sit to Stand: Min assist         General transfer comment: Pt nearly able to rise w/o assist, but still pain limited, guarded and lacking a lot of confidence.  Ambulation/Gait Ambulation/Gait assistance: Min assist Gait Distance (Feet): 35 Feet Assistive device: Rolling walker (2 wheeled)       General Gait Details: Pt initially with very inconsistent cadence, though she was able to improve enough to nearly maintain consistent walker movement once "warmed up."  He HR climbed  into the 120s with modest effort and she had shorteness of breath (even on 2 liters) with sats dropping to high 80s.     Stairs             Wheelchair Mobility    Modified Rankin (Stroke Patients Only)       Balance Overall balance assessment: Needs assistance   Sitting balance-Leahy Scale: Fair Sitting balance - Comments: Pt was able to maintain sitting EOB w/o direct assist.     Standing balance-Leahy Scale: Fair Standing balance comment: Pt highly reliant on the walker to maitnain standing, poor awareness about positioning despite heavy cuing                            Cognition Arousal/Alertness: Awake/alert Behavior During Therapy: WFL for tasks assessed/performed Overall Cognitive Status: History of cognitive impairments - at baseline                                        Exercises Total Joint Exercises Ankle Circles/Pumps: Strengthening;10 reps Quad Sets: Strengthening;15 reps Gluteal Sets: Strengthening;15 reps Short Arc Quad: AROM;10 reps(pt with a lot of pain during SAQ) Heel Slides: Strengthening;10 reps(resisted leg extension) Hip ABduction/ADduction: Strengthening;10 reps Straight Leg Raises: AAROM;5 reps Knee Flexion: PROM;5 reps Goniometric ROM: 3-70    General Comments        Pertinent Vitals/Pain  Pain Score: 9  Pain Location: L knee    Home Living                      Prior Function            PT Goals (current goals can now be found in the care plan section) Progress towards PT goals: Progressing toward goals    Frequency    BID      PT Plan Current plan remains appropriate    Co-evaluation              AM-PAC PT "6 Clicks" Daily Activity  Outcome Measure  Difficulty turning over in bed (including adjusting bedclothes, sheets and blankets)?: A Lot Difficulty moving from lying on back to sitting on the side of the bed? : Unable Difficulty sitting down on and standing up from a  chair with arms (e.g., wheelchair, bedside commode, etc,.)?: Unable Help needed moving to and from a bed to chair (including a wheelchair)?: A Little Help needed walking in hospital room?: A Lot Help needed climbing 3-5 steps with a railing? : A Lot 6 Click Score: 11    End of Session Equipment Utilized During Treatment: Gait belt Activity Tolerance: Patient limited by pain;Treatment limited secondary to agitation Patient left: with chair alarm set;with family/visitor present;with call bell/phone within reach Nurse Communication: Mobility status PT Visit Diagnosis: Unsteadiness on feet (R26.81);Difficulty in walking, not elsewhere classified (R26.2);Muscle weakness (generalized) (M62.81);Pain Pain - Right/Left: Left Pain - part of body: Knee     Time: 0820-0901 PT Time Calculation (min) (ACUTE ONLY): 41 min  Charges:  $Gait Training: 8-22 mins $Therapeutic Exercise: 8-22 mins $Therapeutic Activity: 8-22 mins                     Kreg Shropshire, DPT 11/22/2017, 11:43 AM

## 2017-11-22 NOTE — Care Management Important Message (Signed)
Important Message  Patient Details  Name: Katie Carter MRN: [id number] Date of Birth: June 23, 1945   Medicare Important Message Given:  Yes    [name] 11/22/2017, 11:32 AM

## 2017-11-22 NOTE — Progress Notes (Signed)
Physical Therapy Treatment Patient Details Name: Katie Carter MRN: [id number] DOB: Jun 06, 1945 Today's Date: 11/22/2017    History of Present Illness 72 yo female s/p L TKA WBAT. PMH of SOB, COPD, current smoker, GERD.    PT Comments    On arrival to room this PM prior to session pt's O2 was off, sats in the low 80s, HR 110-120, low BP and pt rather sleepy.  Later she we feeling better with more appropriate vitals (O2 92%, HR 100-110) we were able to do some light in bed exercises.  She was very pain sensitive and did relatively poorly tolerating anything that involved L knee movement.  She showed poor tolerance with ROM tasks, screaming in pain at ~40 degrees of flexion and generally not able to really push herself much this afternoon.  Pt still clearly will require STR once medically ready for d/c.   Follow Up Recommendations  SNF     Equipment Recommendations  Rolling walker with 5" wheels    Recommendations for Other Services       Precautions / Restrictions Precautions Precautions: Knee;Fall Precaution Booklet Issued: Yes (comment) Precaution Comments: educated on simple HEP  Restrictions LLE Weight Bearing: Weight bearing as tolerated    Mobility  Bed Mobility                  Transfers                    Ambulation/Gait                 Stairs             Wheelchair Mobility    Modified Rankin (Stroke Patients Only)       Balance                                            Cognition Arousal/Alertness: Awake/alert Behavior During Therapy: WFL for tasks assessed/performed Overall Cognitive Status: History of cognitive impairments - at baseline                                 General Comments: Pt needed extra time to process even simple cues this afternoon      Exercises Total Joint Exercises Ankle Circles/Pumps: Strengthening;10 reps Quad Sets: Strengthening;15 reps Gluteal Sets:  Strengthening;15 reps Heel Slides: AAROM;5 reps(pt in too much pain to do more) Hip ABduction/ADduction: Strengthening;10 reps Knee Flexion: PROM;5 reps Goniometric ROM: pt screaming in pain at ~40 degrees of PROM flexion    General Comments        Pertinent Vitals/Pain Pain Score: 10-Worst pain ever(during ROM tasks, minimal pain at rest)    Home Living                      Prior Function            PT Goals (current goals can now be found in the care plan section) Progress towards PT goals: Not progressing toward goals - comment(change in vitals, poor pain control)    Frequency    BID      PT Plan Current plan remains appropriate    Co-evaluation              AM-PAC PT "6 Clicks" Daily Activity  Outcome Measure  Difficulty  turning over in bed (including adjusting bedclothes, sheets and blankets)?: A Lot Difficulty moving from lying on back to sitting on the side of the bed? : Unable Difficulty sitting down on and standing up from a chair with arms (e.g., wheelchair, bedside commode, etc,.)?: Unable Help needed moving to and from a bed to chair (including a wheelchair)?: A Lot   Help needed climbing 3-5 steps with a railing? : Total 6 Click Score: 7    End of Session Equipment Utilized During Treatment: Gait belt Activity Tolerance: Patient limited by pain;Treatment limited secondary to agitation Patient left: with chair alarm set;with family/visitor present;with call bell/phone within reach   PT Visit Diagnosis: Unsteadiness on feet (R26.81);Difficulty in walking, not elsewhere classified (R26.2);Muscle weakness (generalized) (M62.81);Pain Pain - Right/Left: Left Pain - part of body: Knee     Time: 8159-4707 PT Time Calculation (min) (ACUTE ONLY): 16 min  Charges:  $Therapeutic Exercise: 8-22 mins                     Kreg Shropshire, DPT 11/22/2017, 5:26 PM

## 2017-11-22 NOTE — Care Management (Signed)
Anticipated discharge for today delayed as patient spiked temp of 100

## 2017-11-22 NOTE — Discharge Summary (Signed)
Physician Discharge Summary  Patient ID: Katie Carter MRN: [id number] DOB/AGE: 1945-05-06 72 y.o.  Admit date: 11/20/2017 Discharge date: 11/22/2017  Admission Diagnoses:  OSTEOARTHRITIS OF LEFT KNEE <principal problem not specified>  Discharge Diagnoses:  OSTEOARTHRITIS OF LEFT KNEE Active Problems:   Osteoarthritis of left knee   Past Medical History:  Diagnosis Date  . Arthritis   . Chronic airway obstruction ([place])   . Dyspnea   . Hypercholesteremia   . Lumbago 05/06/2006  . Thyroid disease 10/15/2005  . Tobacco use disorder     Surgeries: Procedure(s): TOTAL KNEE ARTHROPLASTY hardware removal on 11/20/2017   Consultants (if any):   Discharged Condition: Improved  Hospital Course: SOLYMAR GRACE is an 72 y.o. female who was admitted 11/20/2017 with a diagnosis of  OSTEOARTHRITIS OF LEFT KNEE <principal problem not specified> and went to the operating room on 11/20/2017 and underwent the above named procedures.    She was given perioperative antibiotics:  Anti-infectives (From admission, onward)   Start     Dose/Rate Route Frequency Ordered Stop   11/20/17 1400  ceFAZolin (ANCEF) IVPB 1 g/50 mL premix     1 g 100 mL/hr over 30 Minutes Intravenous Every 6 hours 11/20/17 1155 11/20/17 2007   11/20/17 0820  50,000 units bacitracin in 0.9% normal saline 250 mL irrigation  Status:  Discontinued       As needed 11/20/17 0820 11/20/17 1003   11/20/17 0613  ceFAZolin (ANCEF) 2-4 GM/100ML-% IVPB    Note to Pharmacy:  Phineas Real   : cabinet override      11/20/17 0613 11/20/17 0753   11/20/17 0600  ceFAZolin (ANCEF) IVPB 2g/100 mL premix     2 g 200 mL/hr over 30 Minutes Intravenous On call to O.R. 11/19/17 2146 11/20/17 2536    .  She was given sequential compression devices, early ambulation, and ECASA for DVT prophylaxis.  She benefited maximally from the hospital stay and there were no complications.    Recent vital signs:  Vitals:   11/22/17 0002 11/22/17  0750  BP: 110/90 (!) 106/59  Pulse: 92 95  Resp: 19   Temp: 99.2 F (37.3 C) 98.2 F (36.8 C)  SpO2: 95% 95%    Recent laboratory studies:  Lab Results  Component Value Date   HGB 11.9 (L) 11/22/2017   HGB 12.8 11/21/2017   HGB 15.5 (H) 11/11/2017   Lab Results  Component Value Date   WBC 12.5 (H) 11/22/2017   PLT 242 11/22/2017   Lab Results  Component Value Date   INR 1.01 11/11/2017   Lab Results  Component Value Date   NA 136 11/21/2017   K 4.0 11/21/2017   CL 106 11/21/2017   CO2 26 11/21/2017   BUN 11 11/21/2017   CREATININE 0.61 11/21/2017   GLUCOSE 88 11/21/2017    Discharge Medications:   Allergies as of 11/22/2017      Reactions   Meloxicam Other (See Comments)   Severe chest pain   Tramadol Rash      Medication List    STOP taking these medications   acetaminophen 650 MG CR tablet Commonly known as:  TYLENOL   BC HEADACHE POWDER PO     TAKE these medications   albuterol 108 (90 Base) MCG/ACT inhaler Commonly known as:  PROVENTIL HFA;VENTOLIN HFA Inhale 1 puff into the lungs every 6 (six) hours as needed for wheezing or shortness of breath.   diazepam 5 MG tablet Commonly known as:  VALIUM  Take 1 tablet (5 mg total) by mouth every 8 (eight) hours as needed for anxiety.   docusate sodium 100 MG capsule Commonly known as:  COLACE Take 1 capsule (100 mg total) by mouth 2 (two) times daily.   nicotine 21 mg/24hr patch Commonly known as:  NICODERM CQ - dosed in mg/24 hours Place 1 patch (21 mg total) onto the skin daily. Start taking on:  11/23/2017   oxyCODONE 5 MG immediate release tablet Commonly known as:  Oxy IR/ROXICODONE Take 1-3 tablets (5-15 mg total) by mouth every 4 (four) hours as needed for moderate pain or severe pain.       Diagnostic Studies: Dg Chest 2 View  Result Date: 11/05/2017 CLINICAL DATA:  COPD, current smoker, routine checkup. EXAM: CHEST - 2 VIEW COMPARISON:  Chest x-ray of January 27, 2009 FINDINGS: The  lungs are well-expanded. The interstitial markings are coarse. There is no alveolar infiltrate. There is no pleural effusion. The heart and pulmonary vascularity are normal. There is calcification in the wall of the aortic arch. The bony thorax exhibits no acute abnormality. IMPRESSION: Chronic bronchitic changes slightly more conspicuous than on the earlier study. No alveolar pneumonia nor CHF. Thoracic aortic atherosclerosis. Electronically Signed   By: David  Martinique M.D.   On: 11/05/2017 15:09   Dg Knee Left Port  Result Date: 11/20/2017 CLINICAL DATA:  Postop left knee replacement EXAM: PORTABLE LEFT KNEE - 1-2 VIEW COMPARISON:  09/09/2017 FINDINGS: Total knee replacement left in satisfactory position alignment. No immediate complication IMPRESSION: Satisfactory left knee replacement Electronically Signed   By: Franchot Gallo M.D.   On: 11/20/2017 10:49    Disposition:     Contact information for after-discharge care    Destination    [facility] Preferred SNF .   Service:  Skilled Nursing Contact information: [address] [place] [phone]               Signed: Lovell Sheehan ,MD 11/22/2017, 4:35 PM

## 2017-11-22 NOTE — Progress Notes (Signed)
Plan of care discussed with patient and family member

## 2017-11-22 NOTE — Anesthesia Postprocedure Evaluation (Signed)
Anesthesia Post Note  Patient: Katie Carter  Procedure(s) Performed: TOTAL KNEE ARTHROPLASTY hardware removal (Left )  Patient location during evaluation: Nursing Unit Anesthesia Type: Spinal Level of consciousness: oriented and awake and alert Pain management: pain level controlled Vital Signs Assessment: post-procedure vital signs reviewed and stable Respiratory status: spontaneous breathing and respiratory function stable Cardiovascular status: blood pressure returned to baseline and stable Postop Assessment: no headache, no backache, no apparent nausea or vomiting, patient able to bend at knees and able to ambulate Anesthetic complications: no     Last Vitals:  Vitals:   11/21/17 1604 11/22/17 0002  BP: (!) 123/58 110/90  Pulse: 93 92  Resp:  19  Temp: 36.8 C 37.3 C  SpO2: 97% 95%    Last Pain:  Vitals:   11/22/17 0559  TempSrc:   PainSc: 10-Worst pain ever                 Hedda Slade

## 2017-11-22 NOTE — Progress Notes (Signed)
Per Tam[facility] admissions coordinator [facility] authorization has been received and patient can come over the weekend to room 602. Per MD patient will likely D/C tomorrow pending medical clearance. Patient, her significant other [name] and daughter Freda Munro are aware of above.    [employer], LCSW [phone]

## 2017-11-23 DIAGNOSIS — J449 Chronic obstructive pulmonary disease, unspecified: Secondary | ICD-10-CM | POA: Diagnosis not present

## 2017-11-23 DIAGNOSIS — Z9911 Dependence on respirator [ventilator] status: Secondary | ICD-10-CM | POA: Diagnosis not present

## 2017-11-23 DIAGNOSIS — R1312 Dysphagia, oropharyngeal phase: Secondary | ICD-10-CM | POA: Diagnosis not present

## 2017-11-23 DIAGNOSIS — R498 Other voice and resonance disorders: Secondary | ICD-10-CM | POA: Diagnosis not present

## 2017-11-23 DIAGNOSIS — Z720 Tobacco use: Secondary | ICD-10-CM | POA: Diagnosis not present

## 2017-11-23 DIAGNOSIS — R52 Pain, unspecified: Secondary | ICD-10-CM | POA: Diagnosis not present

## 2017-11-23 DIAGNOSIS — M6281 Muscle weakness (generalized): Secondary | ICD-10-CM | POA: Diagnosis not present

## 2017-11-23 DIAGNOSIS — R0600 Dyspnea, unspecified: Secondary | ICD-10-CM | POA: Diagnosis not present

## 2017-11-23 DIAGNOSIS — Z96652 Presence of left artificial knee joint: Secondary | ICD-10-CM | POA: Diagnosis not present

## 2017-11-23 DIAGNOSIS — R062 Wheezing: Secondary | ICD-10-CM | POA: Diagnosis not present

## 2017-11-23 DIAGNOSIS — K5900 Constipation, unspecified: Secondary | ICD-10-CM | POA: Diagnosis not present

## 2017-11-23 DIAGNOSIS — M1712 Unilateral primary osteoarthritis, left knee: Secondary | ICD-10-CM | POA: Diagnosis not present

## 2017-11-23 LAB — CBC
HCT: 33 % — ABNORMAL LOW (ref 36.0–46.0)
HEMOGLOBIN: 10.7 g/dL — AB (ref 12.0–15.0)
MCH: 29.3 pg (ref 26.0–34.0)
MCHC: 32.4 g/dL (ref 30.0–36.0)
MCV: 90.4 fL (ref 80.0–100.0)
PLATELETS: 210 10*3/uL (ref 150–400)
RBC: 3.65 MIL/uL — AB (ref 3.87–5.11)
RDW: 13.6 % (ref 11.5–15.5)
WBC: 12.1 10*3/uL — AB (ref 4.0–10.5)
nRBC: 0 % (ref 0.0–0.2)

## 2017-11-23 MED ORDER — SODIUM CHLORIDE 0.9 % IV BOLUS
500.0000 mL | Freq: Once | INTRAVENOUS | Status: AC
  Administered 2017-11-23: 500 mL via INTRAVENOUS

## 2017-11-23 NOTE — Progress Notes (Signed)
   PROGRESS NOTE  Subjective:  negative for Chest Pain  negative for Shortness of Breath  negative for Nausea/Vomiting   negative for Calf Pain  negative for Bowel Movement   Tolerating Diet: yes         Patient reports pain as 5 on 0-10 scale.    Objective: Vital signs in last 24 hours:    Patient Vitals for the past 24 hrs:  BP Temp Temp src Pulse Resp SpO2  11/23/17 0725 115/64 98.8 F (37.1 C) Oral 91 - 94 %  11/23/17 0226 (!) 94/51 99.1 F (37.3 C) Oral 85 - 98 %  11/23/17 0120 (!) 85/52 99.2 F (37.3 C) Oral 82 16 94 %  11/22/17 1644 (!) 105/36 100 F (37.8 C) Oral (!) 129 - 92 %    @flow {1959:LAST@   Intake/Output from previous day:   11/15 0701 - 11/16 0700 In: 980.3 [P.O.:480] Out: 75 [Urine:75]   Intake/Output this shift:   No intake/output data recorded.   Intake/Output      11/15 0701 - 11/16 0700 11/16 0701 - 11/17 0700   P.O. 480    I.V.     IV Piggyback 500.3    Total Intake 980.3    Urine 75    Stool 0    Total Output 75    Net +905.3         Urine Occurrence 2 x 1 x   Stool Occurrence 1 x 1 x      LABORATORY DATA: Recent Labs    11/21/17 0445 11/22/17 0431 11/23/17 0441  WBC 13.0* 12.5* 12.1*  HGB 12.8 11.9* 10.7*  HCT 39.7 37.3 33.0*  PLT 244 242 210   Recent Labs    11/21/17 0445  NA 136  K 4.0  CL 106  CO2 26  BUN 11  CREATININE 0.61  GLUCOSE 88  CALCIUM 8.3*   Lab Results  Component Value Date   INR 1.01 11/11/2017    Examination:  General appearance: alert, cooperative and no distress Extremities: edema left lower extremity  Wound Exam: clean, dry, intact   Drainage:  None: wound tissue dry  Motor Exam: EHL and FHL Intact  Sensory Exam: Deep Peroneal and Tibial normal   Assessment:    3 Days Post-Op  Procedure(s) (LRB): TOTAL KNEE ARTHROPLASTY hardware removal (Left)  ADDITIONAL DIAGNOSIS:  Active Problems:   Osteoarthritis of left knee  Acute Blood Loss Anemia   Plan: Physical Therapy as  ordered Weight Bearing as Tolerated (WBAT)  DVT Prophylaxis:  Aspirin  DISCHARGE PLAN: Skilled Nursing Facility/Rehab  Given rx for Oxycodone 5mg  and Valium 5mg   Follow up in Office in 12 days         Katie Carter 11/23/2017, 9:03 AM

## 2017-11-23 NOTE — Progress Notes (Signed)
EMS here to transport pt. 

## 2017-11-23 NOTE — Progress Notes (Signed)
Report called to [facility]. EMS called for transportation.

## 2017-11-23 NOTE — Clinical Social Work Note (Signed)
The patient will discharge today to [facility] room 602 via non-emergent EMS due to o2 needs. The patient, her boyfriend, and the facility are aware and in agreement. The CSW has sent updated documentation to the the facility and has delivered the discharge packet. The CSW is signing off. Please consult should additional needs arise.  Santiago Bumpers, MSW, [name] Presser [phone]

## 2017-11-26 DIAGNOSIS — J449 Chronic obstructive pulmonary disease, unspecified: Secondary | ICD-10-CM | POA: Diagnosis not present

## 2017-11-26 DIAGNOSIS — Z720 Tobacco use: Secondary | ICD-10-CM | POA: Diagnosis not present

## 2017-11-26 DIAGNOSIS — Z96652 Presence of left artificial knee joint: Secondary | ICD-10-CM | POA: Diagnosis not present

## 2017-11-27 DIAGNOSIS — Z96659 Presence of unspecified artificial knee joint: Secondary | ICD-10-CM | POA: Insufficient documentation

## 2017-12-04 DIAGNOSIS — Z96652 Presence of left artificial knee joint: Secondary | ICD-10-CM | POA: Diagnosis not present

## 2017-12-04 DIAGNOSIS — Z720 Tobacco use: Secondary | ICD-10-CM | POA: Diagnosis not present

## 2017-12-04 DIAGNOSIS — J449 Chronic obstructive pulmonary disease, unspecified: Secondary | ICD-10-CM | POA: Diagnosis not present

## 2017-12-06 DIAGNOSIS — Z96652 Presence of left artificial knee joint: Secondary | ICD-10-CM | POA: Diagnosis not present

## 2017-12-07 DIAGNOSIS — Z79891 Long term (current) use of opiate analgesic: Secondary | ICD-10-CM | POA: Diagnosis not present

## 2017-12-07 DIAGNOSIS — Z471 Aftercare following joint replacement surgery: Secondary | ICD-10-CM | POA: Diagnosis not present

## 2017-12-07 DIAGNOSIS — Z96652 Presence of left artificial knee joint: Secondary | ICD-10-CM | POA: Diagnosis not present

## 2017-12-07 DIAGNOSIS — M1711 Unilateral primary osteoarthritis, right knee: Secondary | ICD-10-CM | POA: Diagnosis not present

## 2017-12-07 DIAGNOSIS — Z9181 History of falling: Secondary | ICD-10-CM | POA: Diagnosis not present

## 2017-12-07 DIAGNOSIS — Z7982 Long term (current) use of aspirin: Secondary | ICD-10-CM | POA: Diagnosis not present

## 2017-12-10 DIAGNOSIS — M1711 Unilateral primary osteoarthritis, right knee: Secondary | ICD-10-CM | POA: Diagnosis not present

## 2017-12-10 DIAGNOSIS — Z7982 Long term (current) use of aspirin: Secondary | ICD-10-CM | POA: Diagnosis not present

## 2017-12-10 DIAGNOSIS — Z79891 Long term (current) use of opiate analgesic: Secondary | ICD-10-CM | POA: Diagnosis not present

## 2017-12-10 DIAGNOSIS — Z471 Aftercare following joint replacement surgery: Secondary | ICD-10-CM | POA: Diagnosis not present

## 2017-12-10 DIAGNOSIS — Z96652 Presence of left artificial knee joint: Secondary | ICD-10-CM | POA: Diagnosis not present

## 2017-12-10 DIAGNOSIS — Z9181 History of falling: Secondary | ICD-10-CM | POA: Diagnosis not present

## 2017-12-12 DIAGNOSIS — Z471 Aftercare following joint replacement surgery: Secondary | ICD-10-CM | POA: Diagnosis not present

## 2017-12-12 DIAGNOSIS — Z9181 History of falling: Secondary | ICD-10-CM | POA: Diagnosis not present

## 2017-12-12 DIAGNOSIS — M1711 Unilateral primary osteoarthritis, right knee: Secondary | ICD-10-CM | POA: Diagnosis not present

## 2017-12-12 DIAGNOSIS — Z96652 Presence of left artificial knee joint: Secondary | ICD-10-CM | POA: Diagnosis not present

## 2017-12-12 DIAGNOSIS — Z7982 Long term (current) use of aspirin: Secondary | ICD-10-CM | POA: Diagnosis not present

## 2017-12-12 DIAGNOSIS — Z79891 Long term (current) use of opiate analgesic: Secondary | ICD-10-CM | POA: Diagnosis not present

## 2017-12-16 DIAGNOSIS — Z7982 Long term (current) use of aspirin: Secondary | ICD-10-CM | POA: Diagnosis not present

## 2017-12-16 DIAGNOSIS — Z96652 Presence of left artificial knee joint: Secondary | ICD-10-CM | POA: Diagnosis not present

## 2017-12-16 DIAGNOSIS — Z471 Aftercare following joint replacement surgery: Secondary | ICD-10-CM | POA: Diagnosis not present

## 2017-12-16 DIAGNOSIS — Z79891 Long term (current) use of opiate analgesic: Secondary | ICD-10-CM | POA: Diagnosis not present

## 2017-12-16 DIAGNOSIS — M1711 Unilateral primary osteoarthritis, right knee: Secondary | ICD-10-CM | POA: Diagnosis not present

## 2017-12-16 DIAGNOSIS — Z9181 History of falling: Secondary | ICD-10-CM | POA: Diagnosis not present

## 2017-12-20 DIAGNOSIS — Z96652 Presence of left artificial knee joint: Secondary | ICD-10-CM | POA: Diagnosis not present

## 2017-12-20 DIAGNOSIS — Z79891 Long term (current) use of opiate analgesic: Secondary | ICD-10-CM | POA: Diagnosis not present

## 2017-12-20 DIAGNOSIS — Z7982 Long term (current) use of aspirin: Secondary | ICD-10-CM | POA: Diagnosis not present

## 2017-12-20 DIAGNOSIS — M1711 Unilateral primary osteoarthritis, right knee: Secondary | ICD-10-CM | POA: Diagnosis not present

## 2017-12-20 DIAGNOSIS — Z9181 History of falling: Secondary | ICD-10-CM | POA: Diagnosis not present

## 2017-12-20 DIAGNOSIS — Z471 Aftercare following joint replacement surgery: Secondary | ICD-10-CM | POA: Diagnosis not present

## 2017-12-26 DIAGNOSIS — Z96652 Presence of left artificial knee joint: Secondary | ICD-10-CM | POA: Diagnosis not present

## 2018-01-06 DIAGNOSIS — M6281 Muscle weakness (generalized): Secondary | ICD-10-CM | POA: Diagnosis not present

## 2018-01-06 DIAGNOSIS — R262 Difficulty in walking, not elsewhere classified: Secondary | ICD-10-CM | POA: Diagnosis not present

## 2018-03-18 ENCOUNTER — Ambulatory Visit (INDEPENDENT_AMBULATORY_CARE_PROVIDER_SITE_OTHER): Payer: Medicare Other

## 2018-03-18 ENCOUNTER — Ambulatory Visit (INDEPENDENT_AMBULATORY_CARE_PROVIDER_SITE_OTHER): Payer: Medicare Other | Admitting: Family Medicine

## 2018-03-18 ENCOUNTER — Encounter: Payer: Self-pay | Admitting: Family Medicine

## 2018-03-18 VITALS — BP 118/64 | HR 72 | Temp 98.1°F | Ht 62.0 in | Wt 122.2 lb

## 2018-03-18 DIAGNOSIS — Z1159 Encounter for screening for other viral diseases: Secondary | ICD-10-CM | POA: Diagnosis not present

## 2018-03-18 DIAGNOSIS — Z96652 Presence of left artificial knee joint: Secondary | ICD-10-CM

## 2018-03-18 DIAGNOSIS — Z720 Tobacco use: Secondary | ICD-10-CM

## 2018-03-18 DIAGNOSIS — E7800 Pure hypercholesterolemia, unspecified: Secondary | ICD-10-CM | POA: Diagnosis not present

## 2018-03-18 DIAGNOSIS — Z0000 Encounter for general adult medical examination without abnormal findings: Secondary | ICD-10-CM

## 2018-03-18 DIAGNOSIS — K219 Gastro-esophageal reflux disease without esophagitis: Secondary | ICD-10-CM

## 2018-03-18 NOTE — Progress Notes (Signed)
Patient: Katie Carter, Female    DOB: 1945-06-04, 73 y.o.   MRN: [id number] Visit Date: 03/18/2018  Today's Provider: [name], PA   Chief Complaint  Patient presents with  . Annual Exam   Subjective:     Complete Physical REEVA Carter is a 73 y.o. female. She feels well. She reports exercising some. She reports she is sleeping well. -----------------------------------------------------------   Review of Systems  All other systems reviewed and are negative.   Social History   Socioeconomic History  . Marital status: Divorced    Spouse name: Not on file  . Number of children: 2  . Years of education: Not on file  . Highest education level: 9th grade  Occupational History  . Occupation: retired  Scientific laboratory technician  . Financial resource strain: Not hard at all  . Food insecurity:    Worry: Never true    Inability: Never true  . Transportation needs:    Medical: No    Non-medical: No  Tobacco Use  . Smoking status: Current Every Day Smoker    Packs/day: 1.00    Years: 50.00    Pack years: 50.00    Types: Cigarettes  . Smokeless tobacco: Never Used  Substance and Sexual Activity  . Alcohol use: Yes    Comment: Occasionally a couple beers/monthly  . Drug use: No  . Sexual activity: Not on file  Lifestyle  . Physical activity:    Days per week: 0 days    Minutes per session: 0 min  . Stress: Not at all  Relationships  . Social connections:    Talks on phone: Patient refused    Gets together: Patient refused    Attends religious service: Patient refused    Active member of club or organization: Patient refused    Attends meetings of clubs or organizations: Patient refused    Relationship status: Patient refused  . Intimate partner violence:    Fear of current or ex partner: Patient refused    Emotionally abused: Patient refused    Physically abused: Patient refused    Forced sexual activity: Patient refused  Other Topics Concern  . Not on file   Social History Narrative   1 daughter living and has 1 deceased.    Past Medical History:  Diagnosis Date  . Arthritis   . Chronic airway obstruction ([place])   . Dyspnea   . Hypercholesteremia   . Lumbago 05/06/2006  . Thyroid disease 10/15/2005  . Tobacco use disorder      Patient Active Problem List   Diagnosis Date Noted  . Osteoarthritis of left knee 11/20/2017  . Hypercholesteremia 12/10/2014  . CAFL (chronic airflow limitation) ([place]) 02/24/2009  . LBP (low back pain) 05/06/2006  . Acid reflux 10/15/2005  . Adult hypothyroidism 10/15/2005  . Primary localized osteoarthrosis, lower leg 10/15/2005  . Tobacco use 10/15/2005    Past Surgical History:  Procedure Laterality Date  . ABDOMINAL HYSTERECTOMY  1960   abdominal Hysterectomy with bilateral salpingo-oophorectomy  . APPENDECTOMY    . CATARACT EXTRACTION Right 10/2012  . Surgical Fixation  1981   of the left knee fracture  . TOTAL KNEE ARTHROPLASTY Left 11/20/2017   Procedure: TOTAL KNEE ARTHROPLASTY hardware removal;  Surgeon: Lovell Sheehan, MD;  Location: [facility] ORS;  Service: Orthopedics;  Laterality: Left;    Her family history includes Alzheimer's disease in her mother; Cancer in her brother.   Current Outpatient Medications:  .  albuterol (  PROVENTIL HFA;VENTOLIN HFA) 108 (90 BASE) MCG/ACT inhaler, Inhale 1 puff into the lungs every 6 (six) hours as needed for wheezing or shortness of breath. , Disp: , Rfl:  .  diazepam (VALIUM) 5 MG tablet, Take 1 tablet (5 mg total) by mouth every 8 (eight) hours as needed for anxiety., Disp: 30 tablet, Rfl: 0 .  DM-Doxylamine-Acetaminophen (NYQUIL COLD & FLU PO), Take by mouth as needed (@@ night)., Disp: , Rfl:  .  DM-Phenylephrine-Acetaminophen (ALKA-SELTZER PLUS DAY COLD/FLU PO), Take by mouth as needed., Disp: , Rfl:  .  docusate sodium (COLACE) 100 MG capsule, Take 1 capsule (100 mg total) by mouth 2 (two) times daily. (Patient not taking: Reported on 03/18/2018),  Disp: 10 capsule, Rfl: 0 .  nicotine (NICODERM CQ - DOSED IN MG/24 HOURS) 21 mg/24hr patch, Place 1 patch (21 mg total) onto the skin daily. (Patient not taking: Reported on 03/18/2018), Disp: 28 patch, Rfl: 0 .  oxyCODONE (OXY IR/ROXICODONE) 5 MG immediate release tablet, Take 1-3 tablets (5-15 mg total) by mouth every 4 (four) hours as needed for moderate pain or severe pain. (Patient not taking: Reported on 03/18/2018), Disp: 30 tablet, Rfl: 0  Patient Care Team: , Vickki Muff, PA as PCP - General (Family Medicine)     Objective:     Vitals: BP 118/64 (BP Location: Right Arm)   Pulse 72   Temp 98.1 F (36.7 C) (Oral)   Ht '5\' 2"'  (1.575 m)   Wt 122 lb 3.2 oz (55.4 kg)   BMI 22.35 kg/m  BSA 1.56 m   Physical Exam Constitutional:      Appearance: She is well-developed.  HENT:     Head: Normocephalic and atraumatic.     Right Ear: External ear normal.     Left Ear: External ear normal.     Nose: Nose normal.  Eyes:     General:        Right eye: No discharge.     Conjunctiva/sclera: Conjunctivae normal.     Pupils: Pupils are equal, round, and reactive to light.  Neck:     Musculoskeletal: Normal range of motion and neck supple.     Thyroid: No thyromegaly.     Trachea: No tracheal deviation.  Cardiovascular:     Rate and Rhythm: Normal rate and regular rhythm.     Heart sounds: Normal heart sounds. No murmur.  Pulmonary:     Effort: Pulmonary effort is normal. No respiratory distress.     Breath sounds: Normal breath sounds. No wheezing or rales.  Chest:     Chest wall: No tenderness.  Abdominal:     General: There is no distension.     Palpations: Abdomen is soft. There is no mass.     Tenderness: There is no abdominal tenderness. There is no guarding or rebound.  Musculoskeletal: Normal range of motion.        General: No tenderness.     Comments: Well healed scar from left knee arthroplasty. Good ROM and no swelling or tenderness today. Normal gait and  extremity strength.  Lymphadenopathy:     Cervical: No cervical adenopathy.  Skin:    General: Skin is warm and dry.     Findings: No erythema or rash.  Neurological:     Mental Status: She is alert and oriented to person, place, and time.     Cranial Nerves: No cranial nerve deficit.     Motor: No abnormal muscle tone.     Coordination:  Coordination normal.     Deep Tendon Reflexes: Reflexes are normal and symmetric. Reflexes normal.  Psychiatric:        Behavior: Behavior normal.        Thought Content: Thought content normal.        Judgment: Judgment normal.     Activities of Daily Living In your present state of health, do you have any difficulty performing the following activities: 03/18/2018 11/20/2017  Hearing? N -  Vision? N -  Difficulty concentrating or making decisions? N -  Walking or climbing stairs? N -  Dressing or bathing? N -  Doing errands, shopping? N N  Preparing Food and eating ? N -  Using the Toilet? N -  In the past six months, have you accidently leaked urine? N -  Do you have problems with loss of bowel control? N -  Managing your Medications? N -  Managing your Finances? N -  Housekeeping or managing your Housekeeping? N -  Some recent data might be hidden    Fall Risk Assessment Fall Risk  03/18/2018 11/05/2017 03/12/2017 02/23/2016 02/16/2015  Falls in the past year? 0 No No No No     Depression Screen PHQ 2/9 Scores 03/18/2018 11/05/2017 03/12/2017 03/12/2017  PHQ - 2 Score 0 0 0 0  PHQ- 9 Score - - 0 -      Assessment & Plan:    Annual Physical Reviewed patient's Family Medical History Reviewed and updated list of patient's medical providers Assessment of cognitive impairment was done Assessed patient's functional ability Established a written schedule for health screening [place] Completed and Reviewed  Exercise Activities and Dietary recommendations Goals    . DIET - INCREASE WATER INTAKE     Recommend to drink  at least 6-8 8oz glasses of water per day.    . Exercise 3x per week (30 min per time)     Recommend to start exercising 3 days a week for at least minutes a time. Pt to start exercising when the weather warms up.     . Quit Smoking     Recommend to continue efforts to reduce smoking habits until no longer smoking (Smoking Cessation literature attached to AVS).         Immunization History  Administered Date(s) Administered  . Influenza, High Dose Seasonal PF 10/02/2016, 11/05/2017  . Pneumococcal Conjugate-13 10/02/2016  . Pneumococcal Polysaccharide-23 11/05/2017    Health Maintenance  Topic Date Due  . Hepatitis C Screening  [birth year]  . TETANUS/TDAP  01/25/1962  . COLONOSCOPY  01/25/1993  . COLON CANCER SCREENING ANNUAL FOBT  10/03/2017  . INFLUENZA VACCINE  Completed  . DEXA SCAN  Completed  . PNA vac Low Risk Adult  Completed     Discussed health benefits of physical activity, and encouraged her to engage in regular exercise appropriate for her age and condition.    ----------------------------------------------------------------------------- 1. Hypercholesteremia Still using Fish Oil and trying to follow a low fat diet. Medicare Wellness Screening essentially normal. Will consider mammograms once more this year ("later") and given OC-Light Kit to test stool for occult blood. No bowel issues. Needs to stop all smoking. Denies respiratory or cardiac symptoms. BMD up to date. Check routine labs and follow up pending reports. - CBC with Differential/Platelet - Comprehensive metabolic panel - Lipid panel - TSH  2. Tobacco use Continues to smoke 1 ppd for the past 57 years. Denies dyspnea, cough or hemoptysis. Has Albuterol-HFA to use if any wheezing  occurs. Encouraged to stop all smoking and will try the OTC Nicotine patches again. Is a candidate for LDCT scan but refuses test.  3. Gastroesophageal reflux disease without esophagitis Rare reflux or dyspepsia. No  melena, hematemesis, hematochezia or abdominal pain.  - CBC with Differential/Platelet  4. Need for hepatitis C screening test - Hepatitis C antibody  5. History of total knee arthroplasty, left Normal gait and strength with well healed anterior scar from left TKA by Dr. Harlow Mares on 11-20-17.   [name], PA  [facility]

## 2018-03-18 NOTE — Progress Notes (Addendum)
Subjective:   Katie Carter is a 73 y.o. female who presents for Medicare Annual (Subsequent) preventive examination.  Review of Systems:  N/A  Cardiac Risk Factors include: advanced age (>43men, >29 women);smoking/ tobacco exposure     Objective:     Vitals: BP 118/64 (BP Location: Right Arm)   Pulse 72   Temp 98.1 F (36.7 C) (Oral)   Ht 5\' 2"  (1.575 m)   Wt 122 lb 3.2 oz (55.4 kg)   BMI 22.35 kg/m   Body mass index is 22.35 kg/m.  Advanced Directives 03/18/2018 11/20/2017 11/20/2017 11/11/2017 09/09/2017 03/12/2017 02/23/2016  Does Patient Have a Medical Advance Directive? No - No No No No No  Would patient like information on creating a medical advance directive? No - Patient declined No - Patient declined - No - Patient declined No - Patient declined No - Patient declined -    Tobacco Social History   Tobacco Use  Smoking Status Current Every Day Smoker  . Packs/day: 1.00  . Years: 50.00  . Pack years: 50.00  . Types: Cigarettes  Smokeless Tobacco Never Used     Ready to quit: Yes Counseling given: No   Clinical Intake:  Pre-visit preparation completed: Yes  Pain : No/denies pain Pain Score: 0-No pain     Nutritional Status: BMI of 19-24  Normal Nutritional Risks: None Diabetes: No  How often do you need to have someone help you when you read instructions, pamphlets, or other written materials from your doctor or pharmacy?: 1 - Never  Interpreter Needed?: No  Information entered by :: [facility], LPN  Past Medical History:  Diagnosis Date  . Arthritis   . Chronic airway obstruction ([place])   . Dyspnea   . Hypercholesteremia   . Lumbago 05/06/2006  . Thyroid disease 10/15/2005  . Tobacco use disorder    Past Surgical History:  Procedure Laterality Date  . ABDOMINAL HYSTERECTOMY  1960   abdominal Hysterectomy with bilateral salpingo-oophorectomy  . APPENDECTOMY    . CATARACT EXTRACTION Right 10/2012  . Surgical Fixation  1981   of the left  knee fracture  . TOTAL KNEE ARTHROPLASTY Left 11/20/2017   Procedure: TOTAL KNEE ARTHROPLASTY hardware removal;  Surgeon: Lovell Sheehan, MD;  Location: [facility] ORS;  Service: Orthopedics;  Laterality: Left;   Family History  Problem Relation Age of Onset  . Alzheimer's disease Mother   . Cancer Brother    Social History   Socioeconomic History  . Marital status: Divorced    Spouse name: Not on file  . Number of children: 2  . Years of education: Not on file  . Highest education level: 9th grade  Occupational History  . Occupation: retired  Scientific laboratory technician  . Financial resource strain: Not hard at all  . Food insecurity:    Worry: Never true    Inability: Never true  . Transportation needs:    Medical: No    Non-medical: No  Tobacco Use  . Smoking status: Current Every Day Smoker    Packs/day: 1.00    Years: 50.00    Pack years: 50.00    Types: Cigarettes  . Smokeless tobacco: Never Used  Substance and Sexual Activity  . Alcohol use: Yes    Comment: Occasionally a couple beers/monthly  . Drug use: No  . Sexual activity: Not on file  Lifestyle  . Physical activity:    Days per week: 0 days    Minutes per session: 0 min  . Stress:  Not at all  Relationships  . Social connections:    Talks on phone: Patient refused    Gets together: Patient refused    Attends religious service: Patient refused    Active member of club or organization: Patient refused    Attends meetings of clubs or organizations: Patient refused    Relationship status: Patient refused  Other Topics Concern  . Not on file  Social History Narrative   1 daughter living and has 1 deceased.    Outpatient Encounter Medications as of 03/18/2018  Medication Sig  . albuterol (PROVENTIL HFA;VENTOLIN HFA) 108 (90 BASE) MCG/ACT inhaler Inhale 1 puff into the lungs every 6 (six) hours as needed for wheezing or shortness of breath.   . diazepam (VALIUM) 5 MG tablet Take 1 tablet (5 mg total) by mouth every 8  (eight) hours as needed for anxiety.  Marland Kitchen DM-Doxylamine-Acetaminophen (NYQUIL COLD & FLU PO) Take by mouth as needed (@@ night).  Marland Kitchen DM-Phenylephrine-Acetaminophen (ALKA-SELTZER PLUS DAY COLD/FLU PO) Take by mouth as needed.  . docusate sodium (COLACE) 100 MG capsule Take 1 capsule (100 mg total) by mouth 2 (two) times daily. (Patient not taking: Reported on 03/18/2018)  . nicotine (NICODERM CQ - DOSED IN MG/24 HOURS) 21 mg/24hr patch Place 1 patch (21 mg total) onto the skin daily. (Patient not taking: Reported on 03/18/2018)  . oxyCODONE (OXY IR/ROXICODONE) 5 MG immediate release tablet Take 1-3 tablets (5-15 mg total) by mouth every 4 (four) hours as needed for moderate pain or severe pain. (Patient not taking: Reported on 03/18/2018)   No facility-administered encounter medications on file as of 03/18/2018.     Activities of Daily Living In your present state of health, do you have any difficulty performing the following activities: 03/18/2018 11/20/2017  Hearing? N -  Vision? N -  Difficulty concentrating or making decisions? N -  Walking or climbing stairs? N -  Dressing or bathing? N -  Doing errands, shopping? N N  Preparing Food and eating ? N -  Using the Toilet? N -  In the past six months, have you accidently leaked urine? N -  Do you have problems with loss of bowel control? N -  Managing your Medications? N -  Managing your Finances? N -  Housekeeping or managing your Housekeeping? N -  Some recent data might be hidden    Patient Care Team: Chrismon, Vickki Muff, PA as PCP - General (Family Medicine)    Assessment:   This is a routine wellness examination for Katie Carter.  Exercise Activities and Dietary recommendations Current Exercise Habits: Home exercise routine, Type of exercise: Other - see comments(stationary bike), Time (Minutes): 20, Frequency (Times/Week): 3, Weekly Exercise (Minutes/Week): 60, Intensity: Mild, Exercise limited by: orthopedic condition(s)  Goals    . DIET  - INCREASE WATER INTAKE     Recommend to drink at least 6-8 8oz glasses of water per day.    . Exercise 3x per week (30 min per time)     Recommend to start exercising 3 days a week for at least minutes a time. Pt to start exercising when the weather warms up.     . Quit Smoking     Recommend to continue efforts to reduce smoking habits until no longer smoking (Smoking Cessation literature attached to AVS).         Fall Risk Fall Risk  03/18/2018 11/05/2017 03/12/2017 02/23/2016 02/16/2015  Falls in the past year? 0 No No No No   FALL  RISK PREVENTION PERTAINING TO THE HOME: Any stairs in or around the home? Yes  If so, do they handrails? Yes   Home free of loose throw rugs in walkways, pet beds, electrical cords, etc? Yes  Adequate lighting in your home to reduce risk of falls? Yes   ASSISTIVE DEVICES UTILIZED TO PREVENT FALLS:  Life alert? No  Use of a cane, walker or w/c? Yes  Grab bars in the bathroom? No  Shower chair or bench in shower? No  Elevated toilet seat or a handicapped toilet? Yes    TIMED UP AND GO:  Was the test performed? No .    Depression Screen PHQ 2/9 Scores 03/18/2018 11/05/2017 03/12/2017 03/12/2017  PHQ - 2 Score 0 0 0 0  PHQ- 9 Score - - 0 -     Cognitive Function: Declined today.        Immunization History  Administered Date(s) Administered  . Influenza, High Dose Seasonal PF 10/02/2016, 11/05/2017  . Pneumococcal Conjugate-13 10/02/2016  . Pneumococcal Polysaccharide-23 11/05/2017    Qualifies for Shingles Vaccine? Yes . Due for Shingrix. Education has been provided regarding the importance of this vaccine. Pt has been advised to call insurance company to determine out of pocket expense. Advised may also receive vaccine at local pharmacy or Health Dept. Verbalized acceptance and understanding.  Tdap: Although this vaccine is not a covered service during a Wellness Exam, does the patient still wish to receive this vaccine today?  No .    Education has been provided regarding the importance of this vaccine. Advised may receive this vaccine at local pharmacy or Health Dept. Aware to provide a copy of the vaccination record if obtained from local pharmacy or Health Dept. Verbalized acceptance and understanding.  Flu Vaccine: Up to date  Pneumococcal Vaccine: Up to date  Screening Tests Health Maintenance  Topic Date Due  . Hepatitis C Screening  August 24, 1945  . TETANUS/TDAP  01/25/1962  . COLONOSCOPY  01/25/1993  . COLON CANCER SCREENING ANNUAL FOBT  10/03/2017  . INFLUENZA VACCINE  Completed  . DEXA SCAN  Completed  . PNA vac Low Risk Adult  Completed    Cancer Screenings:  Colorectal Screening: FOBT Completed 09/13/16. Repeat every year. Declined order for cologaurd or colonoscopy referral.  Mammogram: Completed 08/15/16.   Bone Density: Completed 03/01/14. Unsure of results. Pt declined scheduling a repeat DEXA.  Lung Cancer Screening: (Low Dose CT Chest recommended if Age 43-80 years, 30 pack-year currently smoking OR have quit w/in 15years.) does qualify, however declines order today.    Additional Screening:  Hepatitis C Screening: does qualify; however declines order today.   Vision Screening: Recommended annual ophthalmology exams for early detection of glaucoma and other disorders of the eye.  Dental Screening: Recommended annual dental exams for proper oral hygiene  Community Resource Referral:  CRR required this visit?  No       Plan:  I have personally reviewed and addressed the Medicare Annual Wellness questionnaire and have noted the following in the patient's chart:  A. Medical and social history B. Use of alcohol, tobacco or illicit drugs  C. Current medications and supplements D. Functional ability and status E.  Nutritional status F.  Physical activity G. Advance directives H. List of other physicians I.  Hospitalizations, surgeries, and ER visits in previous 12 months J.   [place] such as hearing and vision if needed, cognitive and depression L. Referrals and appointments - none  In addition, I have reviewed  and discussed with patient certain preventive protocols, quality metrics, and best practice recommendations. A written personalized care plan for preventive services as well as general preventive health recommendations were provided to patient.  See attached scanned questionnaire for additional information.   Signed,  Fabio Neighbors, LPN Nurse Health Advisor   Nurse Recommendations: Pt declined Hep C lab order, tetanus vaccine, cologaurd order and colonoscopy referral.   Reviewed note and plan of Nurse Health Advisor. Was available for consultation. Agree with documentation and review recommendations with patient during exam visit.

## 2018-03-18 NOTE — Patient Instructions (Signed)
[name] , Thank you for taking time to come for your Medicare Wellness Visit. I appreciate your ongoing commitment to your health goals. Please review the following plan we discussed and let me know if I can assist you in the future.   Screening recommendations/referrals: Colonoscopy: Pt declines referral today.  Mammogram: Up to date, due 08/2018 Bone Density: Pt declines order today.  Recommended yearly ophthalmology/optometry visit for glaucoma screening and checkup Recommended yearly dental visit for hygiene and checkup  Vaccinations: Influenza vaccine: Up to date Pneumococcal vaccine: Completed series Tdap vaccine: Pt declines today.  Shingles vaccine: Pt declines today.     Advanced directives: Advance directive discussed with you today. Even though you declined this today please call our office should you change your mind and we can give you the proper paperwork for you to fill out.  Conditions/risks identified: Smoking cessation; Increase water intake.  Next appointment: 10:40 AM with [name].   Preventive Care 6 Years and Older, Female Preventive care refers to lifestyle choices and visits with your health care provider that can promote health and wellness. What does preventive care include?  A yearly physical exam. This is also called an annual well check.  Dental exams once or twice a year.  Routine eye exams. Ask your health care provider how often you should have your eyes checked.  Personal lifestyle choices, including:  Daily care of your teeth and gums.  Regular physical activity.  Eating a healthy diet.  Avoiding tobacco and drug use.  Limiting alcohol use.  Practicing safe sex.  Taking low-dose aspirin every day.  Taking vitamin and mineral supplements as recommended by your health care provider. What happens during an annual well check? The services and screenings done by your health care provider during your annual well check will  depend on your age, overall health, lifestyle risk factors, and family history of disease. Counseling  Your health care provider may ask you questions about your:  Alcohol use.  Tobacco use.  Drug use.  Emotional well-being.  Home and relationship well-being.  Sexual activity.  Eating habits.  History of falls.  Memory and ability to understand (cognition).  Work and work Statistician.  Reproductive health. Screening  You may have the following tests or measurements:  Height, weight, and BMI.  Blood pressure.  Lipid and cholesterol levels. These may be checked every 5 years, or more frequently if you are over 78 years old.  Skin check.  Lung cancer screening. You may have this screening every year starting at age 27 if you have a 30-pack-year history of smoking and currently smoke or have quit within the past 15 years.  Fecal occult blood test (FOBT) of the stool. You may have this test every year starting at age 3.  Flexible sigmoidoscopy or colonoscopy. You may have a sigmoidoscopy every 5 years or a colonoscopy every 10 years starting at age 43.  Hepatitis C blood test.  Hepatitis B blood test.  Sexually transmitted disease (STD) testing.  Diabetes screening. This is done by checking your blood sugar (glucose) after you have not eaten for a while (fasting). You may have this done every 1-3 years.  Bone density scan. This is done to screen for osteoporosis. You may have this done starting at age 34.  Mammogram. This may be done every 1-2 years. Talk to your health care provider about how often you should have regular mammograms. Talk with your health care provider about your test results, treatment options, and  if necessary, the need for more tests. Vaccines  Your health care provider may recommend certain vaccines, such as:  Influenza vaccine. This is recommended every year.  Tetanus, diphtheria, and acellular pertussis (Tdap, Td) vaccine. You may need a  Td booster every 10 years.  Zoster vaccine. You may need this after age 77.  Pneumococcal 13-valent conjugate (PCV13) vaccine. One dose is recommended after age 44.  Pneumococcal polysaccharide (PPSV23) vaccine. One dose is recommended after age 68. Talk to your health care provider about which screenings and vaccines you need and how often you need them. This information is not intended to replace advice given to you by your health care provider. Make sure you discuss any questions you have with your health care provider. Document Released: 01/21/2015 Document Revised: 09/14/2015 Document Reviewed: 10/26/2014 Elsevier Interactive Patient Education  2017 [place] Prevention in the Home Falls can cause injuries. They can happen to people of all ages. There are many things you can do to make your home safe and to help prevent falls. What can I do on the outside of my home?  Regularly fix the edges of walkways and driveways and fix any cracks.  Remove anything that might make you trip as you walk through a door, such as a raised step or threshold.  Trim any bushes or trees on the path to your home.  Use bright outdoor lighting.  Clear any walking paths of anything that might make someone trip, such as rocks or tools.  Regularly check to see if handrails are loose or broken. Make sure that both sides of any steps have handrails.  Any raised decks and porches should have guardrails on the edges.  Have any leaves, snow, or ice cleared regularly.  Use sand or salt on walking paths during winter.  Clean up any spills in your garage right away. This includes oil or grease spills. What can I do in the bathroom?  Use night lights.  Install grab bars by the toilet and in the tub and shower. Do not use towel bars as grab bars.  Use non-skid mats or decals in the tub or shower.  If you need to sit down in the shower, use a plastic, non-slip stool.  Keep the floor dry. Clean  up any water that spills on the floor as soon as it happens.  Remove soap buildup in the tub or shower regularly.  Attach bath mats securely with double-sided non-slip rug tape.  Do not have throw rugs and other things on the floor that can make you trip. What can I do in the bedroom?  Use night lights.  Make sure that you have a light by your bed that is easy to reach.  Do not use any sheets or blankets that are too big for your bed. They should not hang down onto the floor.  Have a firm chair that has side arms. You can use this for support while you get dressed.  Do not have throw rugs and other things on the floor that can make you trip. What can I do in the kitchen?  Clean up any spills right away.  Avoid walking on wet floors.  Keep items that you use a lot in easy-to-reach places.  If you need to reach something above you, use a strong step stool that has a grab bar.  Keep electrical cords out of the way.  Do not use floor polish or wax that makes floors slippery. If you  must use wax, use non-skid floor wax.  Do not have throw rugs and other things on the floor that can make you trip. What can I do with my stairs?  Do not leave any items on the stairs.  Make sure that there are handrails on both sides of the stairs and use them. Fix handrails that are broken or loose. Make sure that handrails are as long as the stairways.  Check any carpeting to make sure that it is firmly attached to the stairs. Fix any carpet that is loose or worn.  Avoid having throw rugs at the top or bottom of the stairs. If you do have throw rugs, attach them to the floor with carpet tape.  Make sure that you have a light switch at the top of the stairs and the bottom of the stairs. If you do not have them, ask someone to add them for you. What else can I do to help prevent falls?  Wear shoes that:  Do not have high heels.  Have rubber bottoms.  Are comfortable and fit you well.  Are  closed at the toe. Do not wear sandals.  If you use a stepladder:  Make sure that it is fully opened. Do not climb a closed stepladder.  Make sure that both sides of the stepladder are locked into place.  Ask someone to hold it for you, if possible.  Clearly mark and make sure that you can see:  Any grab bars or handrails.  First and last steps.  Where the edge of each step is.  Use tools that help you move around (mobility aids) if they are needed. These include:  Canes.  Walkers.  Scooters.  Crutches.  Turn on the lights when you go into a dark area. Replace any light bulbs as soon as they burn out.  Set up your furniture so you have a clear path. Avoid moving your furniture around.  If any of your floors are uneven, fix them.  If there are any pets around you, be aware of where they are.  Review your medicines with your doctor. Some medicines can make you feel dizzy. This can increase your chance of falling. Ask your doctor what other things that you can do to help prevent falls. This information is not intended to replace advice given to you by your health care provider. Make sure you discuss any questions you have with your health care provider. Document Released: 10/21/2008 Document Revised: 06/02/2015 Document Reviewed: 01/29/2014 Elsevier Interactive Patient Education  2017 [employer].

## 2018-07-10 ENCOUNTER — Telehealth: Payer: Self-pay | Admitting: Family Medicine

## 2018-07-10 NOTE — Chronic Care Management (AMB) (Signed)
Chronic Care Management   Note  07/10/2018 Name: [name] MRN: [id number] DOB: 03/03/1945  Katie Carter is a 73 y.o. year old female who is a primary care patient of Chrismon, Vickki Muff, [place]. I reached out to Katie Carter by phone today in response to a referral sent by Ms. Antony Salmon Capizzi's health plan.    Ms. Krone was given information about Chronic Care Management services today including:  1. CCM service includes personalized support from designated clinical staff supervised by her physician, including individualized plan of care and coordination with other care providers 2. 24/7 contact phone numbers for assistance for urgent and routine care needs. 3. Service will only be billed when office clinical staff spend 20 minutes or more in a month to coordinate care. 4. Only one practitioner may furnish and bill the service in a calendar month. 5. The patient may stop CCM services at any time (effective at the end of the month) by phone call to the office staff. 6. The patient will be responsible for cost sharing (co-pay) of up to 20% of the service fee (after annual deductible is met).  Patient did not agree to enrollment in care management services and does not wish to consider at this time.  Follow up plan: The patient has been provided with contact information for the chronic care management team and has been advised to call with any health related questions or concerns.   [place]  [email or web address]   ??[phone]

## 2018-09-09 ENCOUNTER — Encounter: Payer: Self-pay | Admitting: Family Medicine

## 2018-09-19 ENCOUNTER — Other Ambulatory Visit: Payer: Self-pay

## 2018-09-19 ENCOUNTER — Encounter: Payer: Self-pay | Admitting: Family Medicine

## 2018-09-19 ENCOUNTER — Other Ambulatory Visit: Payer: Self-pay | Admitting: Family Medicine

## 2018-09-19 ENCOUNTER — Ambulatory Visit (INDEPENDENT_AMBULATORY_CARE_PROVIDER_SITE_OTHER): Payer: Medicare Other | Admitting: Family Medicine

## 2018-09-19 VITALS — BP 110/70 | HR 72 | Temp 98.6°F | Resp 16 | Wt 134.0 lb

## 2018-09-19 DIAGNOSIS — E7800 Pure hypercholesterolemia, unspecified: Secondary | ICD-10-CM | POA: Diagnosis not present

## 2018-09-19 DIAGNOSIS — Z720 Tobacco use: Secondary | ICD-10-CM

## 2018-09-19 DIAGNOSIS — K219 Gastro-esophageal reflux disease without esophagitis: Secondary | ICD-10-CM

## 2018-09-19 DIAGNOSIS — Z96652 Presence of left artificial knee joint: Secondary | ICD-10-CM | POA: Diagnosis not present

## 2018-09-19 DIAGNOSIS — G2581 Restless legs syndrome: Secondary | ICD-10-CM

## 2018-09-19 DIAGNOSIS — Z23 Encounter for immunization: Secondary | ICD-10-CM | POA: Diagnosis not present

## 2018-09-19 MED ORDER — ROPINIROLE HCL 0.25 MG PO TABS: 0.2500 mg | ORAL_TABLET | Freq: Every day | ORAL | 1 refills | Status: DC

## 2018-09-19 NOTE — Progress Notes (Signed)
Patient: Katie Carter, Female    DOB: [birth year], 73 y.o.   MRN: [id number] Visit Date: 09/19/2018  Today's Provider: [name], PA   Chief Complaint  Patient presents with   Hypercholesterolemia Restless Leg Syndrome History of arthritis of left knee   Subjective:   Patient had AWV 03/18/2018.    Annual physical visit Katie Carter is a 73 y.o. female. She feels well. She reports exercising not regularly. She reports she is sleeping fairly well.  Mammogram- 08/25/2016. Normal. Repeat in 1 year.  BMD- 03/01/2014. Repeat in 2 years.    Review of Systems  All other systems reviewed and are negative.   Social History   Socioeconomic History   Marital status: Divorced    Spouse name: Not on file   Number of children: 2   Years of education: Not on file   Highest education level: 9th grade  Occupational History   Occupation: retired  Scientist, product/process development strain: Not hard at [employer] insecurity    Worry: Never true    Inability: Never true   Transportation needs    Medical: No    Non-medical: No  Tobacco Use   Smoking status: Current Every Day Smoker    Packs/day: 1.00    Years: 50.00    Pack years: 50.00    Types: Cigarettes   Smokeless tobacco: Never Used  Substance and Sexual Activity   Alcohol use: Yes    Comment: Occasionally a couple beers/monthly   Drug use: No   Sexual activity: Not on file  Lifestyle   Physical activity    Days per week: 0 days    Minutes per session: 0 min   Stress: Not at all  Relationships   Social connections    Talks on phone: Patient refused    Gets together: Patient refused    Attends religious service: Patient refused    Active member of club or organization: Patient refused    Attends meetings of clubs or organizations: Patient refused    Relationship status: Patient refused   Intimate partner violence    Fear of current or ex partner: Patient refused    Emotionally  abused: Patient refused    Physically abused: Patient refused    Forced sexual activity: Patient refused  Other Topics Concern   Not on file  Social History Narrative   1 daughter living and has 1 deceased.   Past Medical History:  Diagnosis Date   Arthritis    Chronic airway obstruction ([place])    Dyspnea    Hypercholesteremia    Lumbago 05/06/2006   Thyroid disease 10/15/2005   Tobacco use disorder     Patient Active Problem List   Diagnosis Date Noted   Osteoarthritis of left knee 11/20/2017   Hypercholesteremia 12/10/2014   CAFL (chronic airflow limitation) ([place]) 02/24/2009   LBP (low back pain) 05/06/2006   Acid reflux 10/15/2005   Adult hypothyroidism 10/15/2005   Primary localized osteoarthrosis, lower leg 10/15/2005   Tobacco use 10/15/2005   Past Surgical History:  Procedure Laterality Date   ABDOMINAL HYSTERECTOMY  1960   abdominal Hysterectomy with bilateral salpingo-oophorectomy   APPENDECTOMY     CATARACT EXTRACTION Right 10/2012   Surgical Fixation  1981   of the left knee fracture   TOTAL KNEE ARTHROPLASTY Left 11/20/2017   Procedure: TOTAL KNEE ARTHROPLASTY hardware removal;  Surgeon: Lovell Sheehan, MD;  Location: [facility] ORS;  Service: Orthopedics;  Laterality: Left;   Her family history includes Alzheimer's disease in her mother; Cancer in her brother.  Current Outpatient Medications:    DM-Doxylamine-Acetaminophen (NYQUIL COLD & FLU PO), Take by mouth as needed (@@ night)., Disp: , Rfl:    albuterol (PROVENTIL HFA;VENTOLIN HFA) 108 (90 BASE) MCG/ACT inhaler, Inhale 1 puff into the lungs every 6 (six) hours as needed for wheezing or shortness of breath. , Disp: , Rfl:    diazepam (VALIUM) 5 MG tablet, Take 1 tablet (5 mg total) by mouth every 8 (eight) hours as needed for anxiety. (Patient not taking: Reported on 09/19/2018), Disp: 30 tablet, Rfl: 0   DM-Phenylephrine-Acetaminophen (ALKA-SELTZER PLUS DAY COLD/FLU PO), Take by mouth  as needed., Disp: , Rfl:    docusate sodium (COLACE) 100 MG capsule, Take 1 capsule (100 mg total) by mouth 2 (two) times daily. (Patient not taking: Reported on 03/18/2018), Disp: 10 capsule, Rfl: 0   nicotine (NICODERM CQ - DOSED IN MG/24 HOURS) 21 mg/24hr patch, Place 1 patch (21 mg total) onto the skin daily. (Patient not taking: Reported on 03/18/2018), Disp: 28 patch, Rfl: 0  Patient Care Team: Margo Common, PA as PCP - General (Family Medicine)    Objective:    Vitals: BP 110/70    Pulse 72    Temp 98.6 F (37 C)    Resp 16    Wt 134 lb (60.8 kg)    SpO2 98%    BMI 24.51 kg/m  Wt Readings from Last 3 Encounters:  09/19/18 134 lb (60.8 kg)  03/18/18 122 lb 3.2 oz (55.4 kg)  11/11/17 136 lb (61.7 kg)   Physical Exam Constitutional:      Appearance: She is well-developed.  HENT:     Head: Normocephalic and atraumatic.     Right Ear: External ear normal.     Left Ear: External ear normal.     Nose: Nose normal.  Eyes:     General:        Right eye: No discharge.     Conjunctiva/sclera: Conjunctivae normal.     Pupils: Pupils are equal, round, and reactive to light.  Neck:     Thyroid: No thyromegaly.     Trachea: No tracheal deviation.  Cardiovascular:     Rate and Rhythm: Normal rate and regular rhythm.     Heart sounds: Normal heart sounds. No murmur.  Pulmonary:     Effort: Pulmonary effort is normal. No respiratory distress.     Breath sounds: Normal breath sounds. No wheezing or rales.  Chest:     Chest wall: No tenderness.  Abdominal:     General: There is no distension.     Palpations: Abdomen is soft. There is no mass.     Tenderness: There is no abdominal tenderness. There is no guarding or rebound.  Musculoskeletal:        General: No tenderness. Normal range of motion.     Cervical back: Normal range of motion and neck supple.  Lymphadenopathy:     Cervical: No cervical adenopathy.  Skin:    General: Skin is warm and dry.     Findings: No  erythema or rash.  Neurological:     Mental Status: She is alert and oriented to person, place, and time.     Cranial Nerves: No cranial nerve deficit.     Motor: No abnormal muscle tone.     Coordination: Coordination normal.     Deep Tendon Reflexes: Reflexes are normal and  symmetric. Reflexes normal.  Psychiatric:        Behavior: Behavior normal.        Thought Content: Thought content normal.        Judgment: Judgment normal.     Activities of Daily Living In your present state of health, do you have any difficulty performing the following activities: 03/18/2018 11/20/2017  Hearing? N -  Vision? N -  Difficulty concentrating or making decisions? N -  Walking or climbing stairs? N -  Dressing or bathing? N -  Doing errands, shopping? N N  Preparing Food and eating ? N -  Using the Toilet? N -  In the past six months, have you accidently leaked urine? N -  Do you have problems with loss of bowel control? N -  Managing your Medications? N -  Managing your Finances? N -  Housekeeping or managing your Housekeeping? N -  Some recent data might be hidden   Fall Risk Assessment Fall Risk  03/18/2018 11/05/2017 03/12/2017 02/23/2016 02/16/2015  Falls in the past year? 0 No No No No   Depression Screen PHQ 2/9 Scores 03/18/2018 11/05/2017 03/12/2017 03/12/2017  PHQ - 2 Score 0 0 0 0  PHQ- 9 Score - - 0 -     Assessment & Plan:     Annual Wellness Visit  Reviewed patient's Family Medical History Reviewed and updated list of patient's medical providers Assessment of cognitive impairment was done Assessed patient's functional ability Established a written schedule for health screening [place] Completed and Reviewed  Exercise Activities and Dietary recommendations Goals     DIET - INCREASE WATER INTAKE     Recommend to drink at least 6-8 8oz glasses of water per day.     Exercise 3x per week (30 min per time)     Recommend to start exercising 3 days a  week for at least minutes a time. Pt to start exercising when the weather warms up.      Quit Smoking     Recommend to continue efforts to reduce smoking habits until no longer smoking (Smoking Cessation literature attached to AVS).         Immunization History  Administered Date(s) Administered   Influenza, High Dose Seasonal PF 10/02/2016, 11/05/2017   Pneumococcal Conjugate-13 10/02/2016   Pneumococcal Polysaccharide-23 11/05/2017    Health Maintenance  Topic Date Due   Hepatitis C Screening  1945/12/08   TETANUS/TDAP  01/25/1962   COLONOSCOPY  01/25/1993   COLON CANCER SCREENING ANNUAL FOBT  10/03/2017   INFLUENZA VACCINE  08/09/2018   DEXA SCAN  Completed   PNA vac Low Risk Adult  Completed     Discussed health benefits of physical activity, and encouraged her to engage in regular exercise appropriate for her age and condition.   1. Hypercholesteremia Does not want to take additional medications. Recommend recheck of Lipid Panel, CMP, TSH, CBC and follow low fat diet. May need to reconsider use of statin.  2. Gastroesophageal reflux disease without esophagitis Occasional heartburn relieved by use of antacid (TUMS). No melena, hematemesis or dysphagia. May use Pepcid prn. Recheck CBC and restrict spicy acidic foods. Should stop smoking.  3. Needs flu shot Refuses flu shot.  4. History of total knee arthroplasty, left Followed by Dr. Harlow Mares (orthopedist) with surgery on 11-20-17. Has a history of left knee fracture 20 years ago requiring large staples for fixation.  5. Tobacco use Continues to smoke about 1.5 ppd. Counseled regarding smoking cessation  and use of Nicotine patches.  6. Restless leg syndrome Did not feel the Mirapex helped with "jumpy legs". Will switch to Requip 0.25 mg hs.   [name], PA  [facility]

## 2018-10-08 ENCOUNTER — Other Ambulatory Visit: Payer: Self-pay | Admitting: Family Medicine

## 2018-10-08 DIAGNOSIS — G2581 Restless legs syndrome: Secondary | ICD-10-CM

## 2018-10-09 ENCOUNTER — Other Ambulatory Visit: Payer: Self-pay | Admitting: Family Medicine

## 2018-10-09 MED ORDER — ROPINIROLE HCL 0.5 MG PO TABS: 0.5000 mg | ORAL_TABLET | Freq: Every day | ORAL | 3 refills | Status: DC

## 2018-11-13 ENCOUNTER — Telehealth: Payer: Self-pay | Admitting: Family Medicine

## 2018-11-13 NOTE — Telephone Encounter (Signed)
Pt needing a call back on rOPINIRole (REQUIP) 0.5 MG tablet. Pt was taking a lower amount of 0.25mg .  She had a dizzy feeling and passing out feeling after taking the .5 mg.  Please cause pt back at [phone] to discuss.  Thanks, [employer]

## 2018-11-14 ENCOUNTER — Other Ambulatory Visit: Payer: Self-pay | Admitting: Family Medicine

## 2018-11-14 DIAGNOSIS — G2581 Restless legs syndrome: Secondary | ICD-10-CM

## 2018-11-14 MED ORDER — ROPINIROLE HCL 0.25 MG PO TABS: 0.2500 mg | ORAL_TABLET | Freq: Every day | ORAL | 1 refills | Status: DC

## 2018-11-14 NOTE — Telephone Encounter (Signed)
Please review. Thanks!  

## 2018-11-14 NOTE — Progress Notes (Signed)
Tried the 0.5 mg hs of the Requip but had some dizziness and "shaking". Requests going back to the 0.25 mg hs for restless leg syndrome and follow up appointment if any return of symptoms.

## 2018-11-14 NOTE — Telephone Encounter (Signed)
Patient is calling back concerning message from earlier. Please advise?

## 2018-11-14 NOTE — Telephone Encounter (Signed)
Pharmacy requesting refills. Thanks!  

## 2018-12-19 ENCOUNTER — Telehealth: Payer: Self-pay | Admitting: Family Medicine

## 2018-12-19 NOTE — Telephone Encounter (Signed)
Can we switch her to something else? °

## 2018-12-19 NOTE — Telephone Encounter (Signed)
rOPINIRole (REQUIP) 0.25 MG tablet     Patient states this medication is 300 dollars and she cannot afford. Patient inquired if there is an alternative medication that is more cost efficient.

## 2018-12-19 NOTE — Telephone Encounter (Signed)
Mirapex 0.125 mg 1 tablet by mouth 2-3 hours before bedtime and may be doubled if not controlled in 4-7 days #40. Report progress in 2-3 weeks.

## 2018-12-22 ENCOUNTER — Other Ambulatory Visit: Payer: Self-pay

## 2018-12-22 ENCOUNTER — Other Ambulatory Visit: Payer: Self-pay | Admitting: Family Medicine

## 2018-12-22 MED ORDER — PRAMIPEXOLE DIHYDROCHLORIDE 0.125 MG PO TABS: 0.1250 mg | ORAL_TABLET | ORAL | 0 refills | Status: DC

## 2018-12-22 NOTE — Telephone Encounter (Signed)
Patient advised and Rx sent to pharmacy 

## 2019-03-18 NOTE — Progress Notes (Addendum)
Subjective:   Katie Carter is a 74 y.o. female who presents for Medicare Annual (Subsequent) preventive examination.    This visit is being conducted through telemedicine due to the COVID-19 pandemic. This patient has given me verbal consent via doximity to conduct this visit, patient states they are participating from their home address. Some vital signs may be absent or patient reported.    Patient identification: identified by name, DOB, and current address  Review of Systems:  N/A  Cardiac Risk Factors include: advanced age (>73men, >19 women);smoking/ tobacco exposure     Objective:     Vitals: There were no vitals taken for this visit.  There is no height or weight on file to calculate BMI. Unable to obtain vitals due to visit being conducted via telephonically.   Advanced Directives 03/19/2019 03/18/2018 11/20/2017 11/20/2017 11/11/2017 09/09/2017 03/12/2017  Does Patient Have a Medical Advance Directive? No No - No No No No  Would patient like information on creating a medical advance directive? No - Patient declined No - Patient declined No - Patient declined - No - Patient declined No - Patient declined No - Patient declined    Tobacco Social History   Tobacco Use  Smoking Status Current Every Day Smoker   Packs/day: 1.00   Years: 50.00   Pack years: 50.00   Types: Cigarettes  Smokeless Tobacco Never Used     Ready to quit: No Counseling given: No   Clinical Intake:  Pre-visit preparation completed: Yes  Pain : No/denies pain Pain Score: 0-No pain     Nutritional Risks: None Diabetes: No  How often do you need to have someone help you when you read instructions, pamphlets, or other written materials from your doctor or pharmacy?: 1 - Never  Interpreter Needed?: No  Information entered by :: [facility], LPN  Past Medical History:  Diagnosis Date   Arthritis    Chronic airway obstruction ([place])    Dyspnea    Hypercholesteremia    Lumbago 05/06/2006     Thyroid disease 10/15/2005   Tobacco use disorder    Past Surgical History:  Procedure Laterality Date   ABDOMINAL HYSTERECTOMY  1960   abdominal Hysterectomy with bilateral salpingo-oophorectomy   APPENDECTOMY     CATARACT EXTRACTION Right 10/2012   Surgical Fixation  1981   of the left knee fracture   TOTAL KNEE ARTHROPLASTY Left 11/20/2017   Procedure: TOTAL KNEE ARTHROPLASTY hardware removal;  Surgeon: Lovell Sheehan, MD;  Location: [facility] ORS;  Service: Orthopedics;  Laterality: Left;   Family History  Problem Relation Age of Onset   Alzheimer's disease Mother    Cancer Brother    Seizures Daughter    Social History   Socioeconomic History   Marital status: Divorced    Spouse name: Not on file   Number of children: 2   Years of education: Not on file   Highest education level: 9th grade  Occupational History   Occupation: retired  Tobacco Use   Smoking status: Current Every Day Smoker    Packs/day: 1.00    Years: 50.00    Pack years: 50.00    Types: Cigarettes   Smokeless tobacco: Never Used  Substance and Sexual Activity   Alcohol use: Yes    Comment: Occasionally a couple beers/monthly   Drug use: No   Sexual activity: Not on file  Other Topics Concern   Not on file  Social History Narrative   1 daughter living and has 1 deceased.  Social Determinants of Health   Financial Resource Strain: Low Risk    Difficulty of Paying Living Expenses: Not hard at all  Food Insecurity: No Food Insecurity   Worried About Charity fundraiser in the Last Year: Never true   [place] in the Last Year: Never true  Transportation Needs: No Transportation Needs   Lack of Transportation (Medical): No   Lack of Transportation (Non-Medical): No  Physical Activity: Inactive   Days of Exercise per Week: 0 days   Minutes of Exercise per Session: 0 min  Stress: No Stress Concern Present   Feeling of Stress : Not at all  Social Connections: Somewhat Isolated    Frequency of Communication with Friends and Family: More than three times a week   Frequency of Social Gatherings with Friends and Family: More than three times a week   Attends Religious Services: Never   Marine scientist or Organizations: No   Attends Archivist Meetings: Never   Marital Status: Living with partner    Outpatient Encounter Medications as of 03/19/2019  Medication Sig   albuterol (PROVENTIL HFA;VENTOLIN HFA) 108 (90 BASE) MCG/ACT inhaler Inhale 1 puff into the lungs every 6 (six) hours as needed for wheezing or shortness of breath.    rOPINIRole (REQUIP) 0.25 MG tablet Take 0.25 mg by mouth at bedtime.   diazepam (VALIUM) 5 MG tablet Take 1 tablet (5 mg total) by mouth every 8 (eight) hours as needed for anxiety. (Patient not taking: Reported on 09/19/2018)   DM-Doxylamine-Acetaminophen (NYQUIL COLD & FLU PO) Take by mouth as needed (@@ night).   DM-Phenylephrine-Acetaminophen (ALKA-SELTZER PLUS DAY COLD/FLU PO) Take by mouth as needed.   docusate sodium (COLACE) 100 MG capsule Take 1 capsule (100 mg total) by mouth 2 (two) times daily. (Patient not taking: Reported on 03/18/2018)   nicotine (NICODERM CQ - DOSED IN MG/24 HOURS) 21 mg/24hr patch Place 1 patch (21 mg total) onto the skin daily. (Patient not taking: Reported on 03/18/2018)   pramipexole (MIRAPEX) 0.125 MG tablet TAKE 1 BY MOUTH 2-3 HOURS BEFORE BEDTIME (Patient not taking: Reported on 03/19/2019)   No facility-administered encounter medications on file as of 03/19/2019.    Activities of Daily Living In your present state of health, do you have any difficulty performing the following activities: 03/19/2019  Hearing? N  Vision? N  Difficulty concentrating or making decisions? N  Walking or climbing stairs? N  Dressing or bathing? N  Doing errands, shopping? N  Preparing Food and eating ? N  Using the Toilet? N  In the past six months, have you accidently leaked urine? N  Do you have problems with  loss of bowel control? N  Managing your Medications? N  Managing your Finances? N  Housekeeping or managing your Housekeeping? N  Some recent data might be hidden    Patient Care Team: Chrismon, Vickki Muff, PA as PCP - General (Family Medicine)    Assessment:   This is a routine wellness examination for Jimmie.  Exercise Activities and Dietary recommendations Current Exercise Habits: The patient does not participate in regular exercise at present, Exercise limited by: orthopedic condition(s)  Goals      DIET - INCREASE WATER INTAKE     Recommend to drink at least 6-8 8oz glasses of water per day.     Exercise 3x per week (30 min per time)     Recommend to start exercising 3 days a week for at least  minutes a time. Pt to start exercising when the weather warms up.      Quit Smoking     Recommend to continue efforts to reduce smoking habits until no longer smoking (Smoking Cessation literature attached to AVS).          Fall Risk: Fall Risk  03/19/2019 03/18/2018 11/05/2017 03/12/2017 02/23/2016  Falls in the past year? 0 0 No No No  Number falls in past yr: 0 - - - -  Injury with Fall? 0 - - - -    FALL RISK PREVENTION PERTAINING TO THE HOME:  Any stairs in or around the home? Yes  If so, are there any without handrails? No   Home free of loose throw rugs in walkways, pet beds, electrical cords, etc? Yes  Adequate lighting in your home to reduce risk of falls? Yes   ASSISTIVE DEVICES UTILIZED TO PREVENT FALLS:  Life alert? No  Use of a cane, walker or w/c? No  Grab bars in the bathroom? Yes  Shower chair or bench in shower? No  Elevated toilet seat or a handicapped toilet? No    TIMED UP AND GO:  Was the test performed? No .    Depression Screen PHQ 2/9 Scores 03/19/2019 03/18/2018 11/05/2017 03/12/2017  PHQ - 2 Score 0 0 0 0  PHQ- 9 Score - - - 0     Cognitive Function: Declined today.        Immunization History  Administered Date(s) Administered    Influenza, High Dose Seasonal PF 10/02/2016, 11/05/2017   Pneumococcal Conjugate-13 10/02/2016   Pneumococcal Polysaccharide-23 11/05/2017    Qualifies for Shingles Vaccine? Yes . Due for Shingrix. Pt has been advised to call insurance company to determine out of pocket expense. Advised may also receive vaccine at local pharmacy or Health Dept. Verbalized acceptance and understanding.  Tdap: Although this vaccine is not a covered service during a Wellness Exam, does the patient still wish to receive this vaccine today?  No . Advised may receive this vaccine at local pharmacy or Health Dept. Aware to provide a copy of the vaccination record if obtained from local pharmacy or Health Dept. Verbalized acceptance and understanding.  Flu Vaccine: Due for Flu vaccine. Does the patient want to receive this vaccine today?  No . Advised may receive this vaccine at local pharmacy or Health Dept. Aware to provide a copy of the vaccination record if obtained from local pharmacy or Health Dept. Verbalized acceptance and understanding.  Pneumococcal Vaccine: Completed series  Screening Tests Health Maintenance  Topic Date Due   Hepatitis C Screening  Never done   INFLUENZA VACCINE  08/09/2018   DEXA SCAN  03/02/2019   TETANUS/TDAP  03/18/2020 (Originally 01/25/1962)   PNA vac Low Risk Adult  Completed    Cancer Screenings:  Colorectal Screening: No longer required.   Mammogram: No longer required.   Bone Density: Completed 03/01/14. Results showed OSTEOPENIA. Pt declined order today.   Lung Cancer Screening: (Low Dose CT Chest recommended if Age 79-80 years, 30 pack-year currently smoking OR have quit w/in 15years.) does not qualify.   Additional Screening:  Vision Screening: Recommended annual ophthalmology exams for early detection of glaucoma and other disorders of the eye.  Dental Screening: Recommended annual dental exams for proper oral hygiene  Community Resource Referral:  CRR  required this visit?  No       Plan:  I have personally reviewed and addressed the Medicare Annual Wellness questionnaire and have noted  the following in the patient's chart:  A. Medical and social history B. Use of alcohol, tobacco or illicit drugs  C. Current medications and supplements D. Functional ability and status E.  Nutritional status F.  Physical activity G. Advance directives H. List of other physicians I.  Hospitalizations, surgeries, and ER visits in previous 12 months J.  [place] such as hearing and vision if needed, cognitive and depression L. Referrals and appointments   In addition, I have reviewed and discussed with patient certain preventive protocols, quality metrics, and best practice recommendations. A written personalized care plan for preventive services as well as general preventive health recommendations were provided to patient. Nurse Health Advisor  Signed,    Kalan Yeley [place], [place]  [id number] Nurse Health Advisor   Nurse Notes: Pt to discuss receiving the influenza vaccine and Hep C lab order and DEXA order at next in office apt.   Reviewed note and plan of Nurse Health Advisor. Agree with documentation and recommendations.

## 2019-03-19 ENCOUNTER — Ambulatory Visit (INDEPENDENT_AMBULATORY_CARE_PROVIDER_SITE_OTHER): Payer: Medicare Other

## 2019-03-19 ENCOUNTER — Encounter: Payer: Self-pay | Admitting: Family Medicine

## 2019-03-19 ENCOUNTER — Other Ambulatory Visit: Payer: Self-pay

## 2019-03-19 DIAGNOSIS — Z0000 Encounter for general adult medical examination without abnormal findings: Secondary | ICD-10-CM

## 2019-03-19 NOTE — Patient Instructions (Signed)
Katie Carter , Thank you for taking time to come for your Medicare Wellness Visit. I appreciate your ongoing commitment to your health goals. Please review the following plan we discussed and let me know if I can assist you in the future.   Screening recommendations/referrals: Colonoscopy: No longer required.  Mammogram: No longer required.  Bone Density: Currently due. Pt to discuss order further with PCP. Recommended yearly ophthalmology/optometry visit for glaucoma screening and checkup Recommended yearly dental visit for hygiene and checkup  Vaccinations: Influenza vaccine: Pt declines today.  Pneumococcal vaccine: Completed series Tdap vaccine: Pt declines today.  Shingles vaccine: Pt declines today.     Advanced directives: Advance directive discussed with you today. Even though you declined this today please call our office should you change your mind and we can give you the proper paperwork for you to fill out.  Conditions/risks identified: Smoking cessation discussed today. Recommend to increase water intake to exercise 3 days a week for 30 minutes at a time.   Next appointment: 03/23/19 @ 9:00 AM with Simona Huh. Declined scheduling an AWV for 2022 at this time.    Preventive Care 33 Years and Older, Female Preventive care refers to lifestyle choices and visits with your health care provider that can promote health and wellness. What does preventive care include?  A yearly physical exam. This is also called an annual well check.  Dental exams once or twice a year.  Routine eye exams. Ask your health care provider how often you should have your eyes checked.  Personal lifestyle choices, including:  Daily care of your teeth and gums.  Regular physical activity.  Eating a healthy diet.  Avoiding tobacco and drug use.  Limiting alcohol use.  Practicing safe sex.  Taking low-dose aspirin every day.  Taking vitamin and mineral supplements as recommended by your health  care provider. What happens during an annual well check? The services and screenings done by your health care provider during your annual well check will depend on your age, overall health, lifestyle risk factors, and family history of disease. Counseling  Your health care provider may ask you questions about your:  Alcohol use.  Tobacco use.  Drug use.  Emotional well-being.  Home and relationship well-being.  Sexual activity.  Eating habits.  History of falls.  Memory and ability to understand (cognition).  Work and work Statistician.  Reproductive health. Screening  You may have the following tests or measurements:  Height, weight, and BMI.  Blood pressure.  Lipid and cholesterol levels. These may be checked every 5 years, or more frequently if you are over 73 years old.  Skin check.  Lung cancer screening. You may have this screening every year starting at age 57 if you have a 30-pack-year history of smoking and currently smoke or have quit within the past 15 years.  Fecal occult blood test (FOBT) of the stool. You may have this test every year starting at age 81.  Flexible sigmoidoscopy or colonoscopy. You may have a sigmoidoscopy every 5 years or a colonoscopy every 10 years starting at age 44.  Hepatitis C blood test.  Hepatitis B blood test.  Sexually transmitted disease (STD) testing.  Diabetes screening. This is done by checking your blood sugar (glucose) after you have not eaten for a while (fasting). You may have this done every 1-3 years.  Bone density scan. This is done to screen for osteoporosis. You may have this done starting at age 33.  Mammogram. This may be  done every 1-2 years. Talk to your health care provider about how often you should have regular mammograms. Talk with your health care provider about your test results, treatment options, and if necessary, the need for more tests. Vaccines  Your health care provider may recommend certain  vaccines, such as:  Influenza vaccine. This is recommended every year.  Tetanus, diphtheria, and acellular pertussis (Tdap, Td) vaccine. You may need a Td booster every 10 years.  Zoster vaccine. You may need this after age 42.  Pneumococcal 13-valent conjugate (PCV13) vaccine. One dose is recommended after age 92.  Pneumococcal polysaccharide (PPSV23) vaccine. One dose is recommended after age 69. Talk to your health care provider about which screenings and vaccines you need and how often you need them. This information is not intended to replace advice given to you by your health care provider. Make sure you discuss any questions you have with your health care provider. Document Released: 01/21/2015 Document Revised: 09/14/2015 Document Reviewed: 10/26/2014 Elsevier Interactive Patient Education  2017 [place] Prevention in the Home Falls can cause injuries. They can happen to people of all ages. There are many things you can do to make your home safe and to help prevent falls. What can I do on the outside of my home?  Regularly fix the edges of walkways and driveways and fix any cracks.  Remove anything that might make you trip as you walk through a door, such as a raised step or threshold.  Trim any bushes or trees on the path to your home.  Use bright outdoor lighting.  Clear any walking paths of anything that might make someone trip, such as rocks or tools.  Regularly check to see if handrails are loose or broken. Make sure that both sides of any steps have handrails.  Any raised decks and porches should have guardrails on the edges.  Have any leaves, snow, or ice cleared regularly.  Use sand or salt on walking paths during winter.  Clean up any spills in your garage right away. This includes oil or grease spills. What can I do in the bathroom?  Use night lights.  Install grab bars by the toilet and in the tub and shower. Do not use towel bars as grab  bars.  Use non-skid mats or decals in the tub or shower.  If you need to sit down in the shower, use a plastic, non-slip stool.  Keep the floor dry. Clean up any water that spills on the floor as soon as it happens.  Remove soap buildup in the tub or shower regularly.  Attach bath mats securely with double-sided non-slip rug tape.  Do not have throw rugs and other things on the floor that can make you trip. What can I do in the bedroom?  Use night lights.  Make sure that you have a light by your bed that is easy to reach.  Do not use any sheets or blankets that are too big for your bed. They should not hang down onto the floor.  Have a firm chair that has side arms. You can use this for support while you get dressed.  Do not have throw rugs and other things on the floor that can make you trip. What can I do in the kitchen?  Clean up any spills right away.  Avoid walking on wet floors.  Keep items that you use a lot in easy-to-reach places.  If you need to reach something above you, use  a strong step stool that has a grab bar.  Keep electrical cords out of the way.  Do not use floor polish or wax that makes floors slippery. If you must use wax, use non-skid floor wax.  Do not have throw rugs and other things on the floor that can make you trip. What can I do with my stairs?  Do not leave any items on the stairs.  Make sure that there are handrails on both sides of the stairs and use them. Fix handrails that are broken or loose. Make sure that handrails are as long as the stairways.  Check any carpeting to make sure that it is firmly attached to the stairs. Fix any carpet that is loose or worn.  Avoid having throw rugs at the top or bottom of the stairs. If you do have throw rugs, attach them to the floor with carpet tape.  Make sure that you have a light switch at the top of the stairs and the bottom of the stairs. If you do not have them, ask someone to add them for  you. What else can I do to help prevent falls?  Wear shoes that:  Do not have high heels.  Have rubber bottoms.  Are comfortable and fit you well.  Are closed at the toe. Do not wear sandals.  If you use a stepladder:  Make sure that it is fully opened. Do not climb a closed stepladder.  Make sure that both sides of the stepladder are locked into place.  Ask someone to hold it for you, if possible.  Clearly mark and make sure that you can see:  Any grab bars or handrails.  First and last steps.  Where the edge of each step is.  Use tools that help you move around (mobility aids) if they are needed. These include:  Canes.  Walkers.  Scooters.  Crutches.  Turn on the lights when you go into a dark area. Replace any light bulbs as soon as they burn out.  Set up your furniture so you have a clear path. Avoid moving your furniture around.  If any of your floors are uneven, fix them.  If there are any pets around you, be aware of where they are.  Review your medicines with your doctor. Some medicines can make you feel dizzy. This can increase your chance of falling. Ask your doctor what other things that you can do to help prevent falls. This information is not intended to replace advice given to you by your health care provider. Make sure you discuss any questions you have with your health care provider. Document Released: 10/21/2008 Document Revised: 06/02/2015 Document Reviewed: 01/29/2014 Elsevier Interactive Patient Education  2017 [employer].

## 2019-03-23 ENCOUNTER — Encounter: Payer: Self-pay | Admitting: Family Medicine

## 2019-03-23 ENCOUNTER — Other Ambulatory Visit: Payer: Self-pay

## 2019-03-23 ENCOUNTER — Ambulatory Visit (INDEPENDENT_AMBULATORY_CARE_PROVIDER_SITE_OTHER): Payer: Medicare Other | Admitting: Family Medicine

## 2019-03-23 VITALS — BP 142/70 | HR 73 | Temp 96.8°F | Ht 62.0 in | Wt 137.0 lb

## 2019-03-23 DIAGNOSIS — E039 Hypothyroidism, unspecified: Secondary | ICD-10-CM

## 2019-03-23 DIAGNOSIS — E7800 Pure hypercholesterolemia, unspecified: Secondary | ICD-10-CM | POA: Diagnosis not present

## 2019-03-23 DIAGNOSIS — K219 Gastro-esophageal reflux disease without esophagitis: Secondary | ICD-10-CM | POA: Diagnosis not present

## 2019-03-23 DIAGNOSIS — Z1159 Encounter for screening for other viral diseases: Secondary | ICD-10-CM

## 2019-03-23 DIAGNOSIS — Z720 Tobacco use: Secondary | ICD-10-CM | POA: Diagnosis not present

## 2019-03-23 DIAGNOSIS — G2581 Restless legs syndrome: Secondary | ICD-10-CM

## 2019-03-23 DIAGNOSIS — Z96652 Presence of left artificial knee joint: Secondary | ICD-10-CM

## 2019-03-23 NOTE — Progress Notes (Signed)
Patient: Katie Carter, Female    DOB: 04-Jul-1945, 74 y.o.   MRN: [id number] Visit Date: 03/23/2019  Today's Provider: [name], PA   Chief Complaint  Patient presents with  . Annual Exam   Subjective:  Katie Carter is a 74 y.o. female who presents today for health maintenance and complete physical. She feels well. She reports exercising some. She reports she is sleeping well.  The patient does complain of her legs jumping.  She is still taking the Ropinirole but does not believe it is helping.  03/19/19 Annual Wellness with the Nurse Health Advisor  08/15/16 Mammogram, [facility]-Negative 03/01/14 Dexa Scan Pap smear-patient is post-hysterectomy Colonoscopy-Fecal Immunochemical Test (FIT) negative 9-/26/18  Patient has declined her Flu and Tdap vaccines  Labs have been ordered, patient has not had them done yet  Immunization History  Administered Date(s) Administered  . Influenza, High Dose Seasonal PF 10/02/2016, 11/05/2017  . Pneumococcal Conjugate-13 10/02/2016  . Pneumococcal Polysaccharide-23 11/05/2017   Review of Systems  Constitutional: Positive for chills.  HENT: Positive for rhinorrhea.   Eyes: Negative.   Respiratory: Positive for cough.   Cardiovascular: Negative.   Gastrointestinal: Negative.   Endocrine: Negative.   Genitourinary: Negative.   Musculoskeletal: Negative.   Skin: Negative.   Allergic/Immunologic: Negative.   Neurological: Negative.   Hematological: Negative.   Psychiatric/Behavioral: Negative.     Social History   Socioeconomic History  . Marital status: Divorced    Spouse name: Not on file  . Number of children: 2  . Years of education: Not on file  . Highest education level: 9th grade  Occupational History  . Occupation: retired  Tobacco Use  . Smoking status: Current Every Day Smoker    Packs/day: 1.00    Years: 50.00    Pack years: 50.00    Types: Cigarettes  . Smokeless tobacco: Never Used  Substance and Sexual  Activity  . Alcohol use: Yes    Comment: Occasionally a couple beers/monthly  . Drug use: No  . Sexual activity: Not on file  Other Topics Concern  . Not on file  Social History Narrative   1 daughter living and has 1 deceased.   Social Determinants of Health   Financial Resource Strain: Low Risk   . Difficulty of Paying Living Expenses: Not hard at all  Food Insecurity: No Food Insecurity  . Worried About Charity fundraiser in the Last Year: Never true  . Ran Out of Food in the Last Year: Never true  Transportation Needs: No Transportation Needs  . Lack of Transportation (Medical): No  . Lack of Transportation (Non-Medical): No  Physical Activity: Inactive  . Days of Exercise per Week: 0 days  . Minutes of Exercise per Session: 0 min  Stress: No Stress Concern Present  . Feeling of Stress : Not at all  Social Connections: Somewhat Isolated  . Frequency of Communication with Friends and Family: More than three times a week  . Frequency of Social Gatherings with Friends and Family: More than three times a week  . Attends Religious Services: Never  . Active Member of Clubs or Organizations: No  . Attends Archivist Meetings: Never  . Marital Status: Living with partner  Intimate Partner Violence: Not At Risk  . Fear of Current or Ex-Partner: No  . Emotionally Abused: No  . Physically Abused: No  . Sexually Abused: No    Patient Active Problem List   Diagnosis Date Noted  . Osteoarthritis  of left knee 11/20/2017  . Hypercholesteremia 12/10/2014  . CAFL (chronic airflow limitation) ([place]) 02/24/2009  . LBP (low back pain) 05/06/2006  . Acid reflux 10/15/2005  . Adult hypothyroidism 10/15/2005  . Primary localized osteoarthrosis, lower leg 10/15/2005  . Tobacco use 10/15/2005    Past Surgical History:  Procedure Laterality Date  . ABDOMINAL HYSTERECTOMY  1960   abdominal Hysterectomy with bilateral salpingo-oophorectomy  . APPENDECTOMY    . CATARACT  EXTRACTION Right 10/2012  . Surgical Fixation  1981   of the left knee fracture  . TOTAL KNEE ARTHROPLASTY Left 11/20/2017   Procedure: TOTAL KNEE ARTHROPLASTY hardware removal;  Surgeon: Lovell Sheehan, MD;  Location: [facility] ORS;  Service: Orthopedics;  Laterality: Left;    Her family history includes Alzheimer's disease in her mother; Cancer in her brother; Seizures in her daughter.     Outpatient Encounter Medications as of 03/23/2019  Medication Sig  . albuterol (PROVENTIL HFA;VENTOLIN HFA) 108 (90 BASE) MCG/ACT inhaler Inhale 1 puff into the lungs every 6 (six) hours as needed for wheezing or shortness of breath.   . DM-Doxylamine-Acetaminophen (NYQUIL COLD & FLU PO) Take by mouth as needed (@@ night).  Marland Kitchen DM-Phenylephrine-Acetaminophen (ALKA-SELTZER PLUS DAY COLD/FLU PO) Take by mouth as needed.  Marland Kitchen rOPINIRole (REQUIP) 0.25 MG tablet Take 0.25 mg by mouth at bedtime.  . diazepam (VALIUM) 5 MG tablet Take 1 tablet (5 mg total) by mouth every 8 (eight) hours as needed for anxiety. (Patient not taking: Reported on 09/19/2018)  . docusate sodium (COLACE) 100 MG capsule Take 1 capsule (100 mg total) by mouth 2 (two) times daily. (Patient not taking: Reported on 03/18/2018)  . nicotine (NICODERM CQ - DOSED IN MG/24 HOURS) 21 mg/24hr patch Place 1 patch (21 mg total) onto the skin daily. (Patient not taking: Reported on 03/18/2018)  . pramipexole (MIRAPEX) 0.125 MG tablet TAKE 1 BY MOUTH 2-3 HOURS BEFORE BEDTIME (Patient not taking: Reported on 03/19/2019)   No facility-administered encounter medications on file as of 03/23/2019.    Patient Care Team: Ekansh Sherk, Vickki Muff, PA as PCP - General (Family Medicine)      Objective:   Vitals:  Vitals:   03/23/19 0908  BP: (!) 142/70  Pulse: 73  Temp: (!) 96.8 F (36 C)  TempSrc: Skin  SpO2: 98%  Weight: 137 lb (62.1 kg)  Height: 5\' 2"  (1.575 m)  Body mass index is 25.06 kg/m.  Physical Exam Constitutional:      Appearance: She is  well-developed.  HENT:     Head: Normocephalic and atraumatic.     Right Ear: External ear normal.     Left Ear: External ear normal.     Nose: Nose normal.  Eyes:     General:        Right eye: No discharge.     Conjunctiva/sclera: Conjunctivae normal.     Pupils: Pupils are equal, round, and reactive to light.  Neck:     Thyroid: No thyromegaly.     Trachea: No tracheal deviation.  Cardiovascular:     Rate and Rhythm: Normal rate and regular rhythm.     Heart sounds: Normal heart sounds. No murmur.  Pulmonary:     Effort: Pulmonary effort is normal. No respiratory distress.     Breath sounds: Normal breath sounds. No wheezing or rales.  Chest:     Chest wall: No tenderness.  Abdominal:     General: There is no distension.     Palpations: Abdomen is  soft. There is no mass.     Tenderness: There is no abdominal tenderness. There is no guarding or rebound.  Musculoskeletal:        General: No tenderness. Normal range of motion.     Cervical back: Normal range of motion and neck supple.  Lymphadenopathy:     Cervical: No cervical adenopathy.  Skin:    General: Skin is warm and dry.     Findings: No erythema or rash.  Neurological:     Mental Status: She is alert and oriented to person, place, and time.     Cranial Nerves: No cranial nerve deficit.     Motor: No abnormal muscle tone.     Coordination: Coordination normal.     Deep Tendon Reflexes: Reflexes are normal and symmetric. Reflexes normal.  Psychiatric:        Behavior: Behavior normal.        Thought Content: Thought content normal.        Judgment: Judgment normal.     Depression Screen PHQ 2/9 Scores 03/19/2019 03/18/2018 11/05/2017 03/12/2017  PHQ - 2 Score 0 0 0 0  PHQ- 9 Score - - - 0    Assessment & Plan:     Routine Health Maintenance and Physical Exam  Exercise Activities and Dietary recommendations Goals    . DIET - INCREASE WATER INTAKE     Recommend to drink at least 6-8 8oz glasses of water  per day.    . Exercise 3x per week (30 min per time)     Recommend to start exercising 3 days a week for at least minutes a time. Pt to start exercising when the weather warms up.     . Quit Smoking     Recommend to continue efforts to reduce smoking habits until no longer smoking (Smoking Cessation literature attached to AVS).         Immunization History  Administered Date(s) Administered  . Influenza, High Dose Seasonal PF 10/02/2016, 11/05/2017  . Pneumococcal Conjugate-13 10/02/2016  . Pneumococcal Polysaccharide-23 11/05/2017    Health Maintenance  Topic Date Due  . Hepatitis C Screening  Never done  . DEXA SCAN  03/02/2019  . INFLUENZA VACCINE  04/08/2019 (Originally 08/09/2018)  . TETANUS/TDAP  03/18/2020 (Originally 01/25/1962)  . PNA vac Low Risk Adult  Completed     Discussed health benefits of physical activity, and encouraged her to engage in regular exercise appropriate for her age and condition.    1. Adult hypothyroidism History of hypothyroidism in 2007. Has not been compliant in getting follow up labs. Last CBC on 11-23-17 showed Hgb 10.7. Denies palpitations, edema, weight loss, diarrhea, constipation, hair loss or brittle nails. Having restless leg syndrome. Recommend checking CBC, CMP and thyroid function. Denies COVID symptoms. Scheduled for COVID vaccination in a week. Aged out of PAP and mammograms. - CBC with Differential/Platelet - Comprehensive metabolic panel - TSH - T4  2. Hypercholesteremia Did not check lipids March 2020. Need to check labs to assess cardiovascular event risk. Last total cholesterol 227, HDL 45, triglycerides 220 and LDL 138  On 02-22-26. - Comprehensive metabolic panel - Lipid panel  3. Gastroesophageal reflux disease without esophagitis Rare dyspepsia without hematemesis or melena. Gets full relief with use of antacid (TUMS). Does not have to use it everyday. Check routine labs and follow up pending reports. - CBC with  Differential/Platelet - Comprehensive metabolic panel  4. Tobacco use Still smoking 1.5 ppd. Did not try the nicotine patches prescribed  at discharge by Dr. Harlow Mares (orthopedist). Denies wheeze or dyspnea. No COVID symptoms. Counseled regarding tapering off before stopping and using the nicotine patches to reduce cravings.  5. History of total knee arthroplasty, left Had surgery by Dr. Harlow Mares (orthopedist) in November 2019 to remove large staples. This has stopped the pain in the left knee. Follow up with him as planned.  6. Restless leg syndrome Got some nausea with last medication she tried for restless leg syndrome. Was taking it at 10-11am with milk. Family still notice some "jumpy legs" at night or sitting on the couch in the evenings. No discomfort and patient does not complaint of sleep disturbance. Will check labs for anemia or electrolyte imbalance. She will let us know which medication she was taking at home (Mirapex or Requip). May need a higher dosage and take it at bedtime to reduce nausea side effect. - CBC with Differential/Platelet - Comprehensive metabolic panel  7. Need for hepatitis C screening test - Hepatitis C antibody

## 2019-03-24 ENCOUNTER — Other Ambulatory Visit: Payer: Self-pay

## 2019-03-24 LAB — COMPREHENSIVE METABOLIC PANEL
ALT: 10 IU/L (ref 0–32)
AST: 12 IU/L (ref 0–40)
Albumin/Globulin Ratio: 1.5 (ref 1.2–2.2)
Albumin: 4.3 g/dL (ref 3.7–4.7)
Alkaline Phosphatase: 86 IU/L (ref 39–117)
BUN/Creatinine Ratio: 11 — ABNORMAL LOW (ref 12–28)
BUN: 11 mg/dL (ref 8–27)
Bilirubin Total: 0.5 mg/dL (ref 0.0–1.2)
CO2: 19 mmol/L — ABNORMAL LOW (ref 20–29)
Calcium: 9.5 mg/dL (ref 8.7–10.3)
Chloride: 101 mmol/L (ref 96–106)
Creatinine, Ser: 0.97 mg/dL (ref 0.57–1.00)
GFR calc Af Amer: 66 mL/min/{1.73_m2} (ref >59)
GFR calc non Af Amer: 57 mL/min/{1.73_m2} — ABNORMAL LOW (ref >59)
Globulin, Total: 2.8 g/dL (ref 1.5–4.5)
Glucose: 83 mg/dL (ref 65–99)
Potassium: 4.3 mmol/L (ref 3.5–5.2)
Sodium: 134 mmol/L (ref 134–144)
Total Protein: 7.1 g/dL (ref 6.0–8.5)

## 2019-03-24 LAB — CBC WITH DIFFERENTIAL/PLATELET
Basophils Absolute: 0.2 10*3/uL (ref 0.0–0.2)
Basos: 2 %
EOS (ABSOLUTE): 0.2 10*3/uL (ref 0.0–0.4)
Eos: 2 %
Hematocrit: 47.3 % — ABNORMAL HIGH (ref 34.0–46.6)
Hemoglobin: 16.5 g/dL — ABNORMAL HIGH (ref 11.1–15.9)
Immature Grans (Abs): 0 10*3/uL (ref 0.0–0.1)
Immature Granulocytes: 0 %
Lymphocytes Absolute: 4.1 10*3/uL — ABNORMAL HIGH (ref 0.7–3.1)
Lymphs: 39 %
MCH: 29.7 pg (ref 26.6–33.0)
MCHC: 34.9 g/dL (ref 31.5–35.7)
MCV: 85 fL (ref 79–97)
Monocytes Absolute: 1 10*3/uL — ABNORMAL HIGH (ref 0.1–0.9)
Monocytes: 10 %
Neutrophils Absolute: 5.1 10*3/uL (ref 1.4–7.0)
Neutrophils: 47 %
Platelets: 280 10*3/uL (ref 150–450)
RBC: 5.56 x10E6/uL — ABNORMAL HIGH (ref 3.77–5.28)
RDW: 12.3 % (ref 11.7–15.4)
WBC: 10.7 10*3/uL (ref 3.4–10.8)

## 2019-03-24 LAB — HEPATITIS C ANTIBODY: Hep C Virus Ab: <0.1 s/co ratio (ref 0.0–0.9)

## 2019-03-24 LAB — LIPID PANEL
Chol/HDL Ratio: 4.6 ratio — ABNORMAL HIGH (ref 0.0–4.4)
Cholesterol, Total: 216 mg/dL — ABNORMAL HIGH (ref 100–199)
HDL: 47 mg/dL (ref >39)
LDL Chol Calc (NIH): 151 mg/dL — ABNORMAL HIGH (ref 0–99)
Triglycerides: 99 mg/dL (ref 0–149)
VLDL Cholesterol Cal: 18 mg/dL (ref 5–40)

## 2019-03-24 LAB — TSH: TSH: 3.83 u[IU]/mL (ref 0.450–4.500)

## 2019-03-24 LAB — T4: T4, Total: 7.5 ug/dL (ref 4.5–12.0)

## 2019-03-24 MED ORDER — PRAVASTATIN SODIUM 20 MG PO TABS: 20.0000 mg | ORAL_TABLET | Freq: Every day | ORAL | 3 refills | Status: DC

## 2019-03-24 NOTE — Progress Notes (Signed)
Orders only

## 2019-05-19 ENCOUNTER — Other Ambulatory Visit: Payer: Self-pay | Admitting: Family Medicine

## 2019-05-19 NOTE — Telephone Encounter (Signed)
Requested medication (s) are due for refill today: yes  Requested medication (s) are on the active medication list: yes  Last refill:  02/10/19  Future visit scheduled: yes  Notes to clinic:  historical provider    Requested Prescriptions  Pending Prescriptions Disp Refills   rOPINIRole (REQUIP) 0.25 MG tablet [Pharmacy Med Name: ROPINIROLE 0.25MG  TABLETS] 90 tablet     Sig: TAKE 1 TABLET(0.25 MG) BY MOUTH AT BEDTIME      Neurology:  Parkinsonian Agents Failed - 05/19/2019  9:19 AM      Failed - Last BP in normal range    BP Readings from Last 1 Encounters:  03/23/19 (!) 142/70          Passed - Valid encounter within last 12 months    Recent Outpatient Visits           1 month ago Adult hypothyroidism   [facility], Vickki Muff, [place]   8 months ago [place], Vickki Muff, [place]   1 year ago [place], Vickki Muff, [place]   1 year ago Preoperative clearance   [facility], Vickki Muff, [place]   1 year ago Primary osteoarthritis of left knee   [facility], [place], [place]       Future Appointments             In 1 month [place], Vickki Muff, [place], [place]

## 2019-05-26 ENCOUNTER — Other Ambulatory Visit: Payer: Self-pay | Admitting: Family Medicine

## 2019-05-26 NOTE — Telephone Encounter (Signed)
Medication Refill - Medication: Ropinirole/patient states she is doing very well on medicine and has only 5 pills left   Has the patient contacted their pharmacy? Yes.   (Agent: If no, request that the patient contact the pharmacy for the refill.) (Agent: If yes, when and what did the pharmacy advise?)  Preferred Pharmacy (with phone number or street name):  [id number] - [place], [place] - [place] Phone:  [phone]  Fax:  [phone]       Agent: Please be advised that RX refills may take up to 3 business days. We ask that you follow-up with your pharmacy.

## 2019-06-17 DIAGNOSIS — M1711 Unilateral primary osteoarthritis, right knee: Secondary | ICD-10-CM | POA: Diagnosis not present

## 2019-06-23 ENCOUNTER — Other Ambulatory Visit: Payer: Self-pay

## 2019-06-23 ENCOUNTER — Encounter: Payer: Self-pay | Admitting: Family Medicine

## 2019-06-23 ENCOUNTER — Ambulatory Visit: Payer: Self-pay | Admitting: Family Medicine

## 2019-06-23 ENCOUNTER — Ambulatory Visit (INDEPENDENT_AMBULATORY_CARE_PROVIDER_SITE_OTHER): Payer: Medicare Other | Admitting: Family Medicine

## 2019-06-23 VITALS — BP 110/62 | HR 67 | Temp 97.1°F | Resp 16 | Wt 138.0 lb

## 2019-06-23 DIAGNOSIS — E7800 Pure hypercholesterolemia, unspecified: Secondary | ICD-10-CM

## 2019-06-23 DIAGNOSIS — Z720 Tobacco use: Secondary | ICD-10-CM

## 2019-06-23 DIAGNOSIS — G2581 Restless legs syndrome: Secondary | ICD-10-CM

## 2019-06-23 NOTE — Progress Notes (Signed)
Established patient visit   Patient: Katie Carter   DOB: 01-28-1945   74 y.o. Female  MRN: [id number] Visit Date: 06/23/2019  Today's healthcare provider: [name], PA   Chief Complaint  Patient presents with  . Hyperlipidemia   Subjective    HPI Lipid/Cholesterol, Follow-up  Last lipid panel Other pertinent labs  Lab Results  Component Value Date   CHOL 216 (H) 03/23/2019   HDL 47 03/23/2019   LDLCALC 151 (H) 03/23/2019   TRIG 99 03/23/2019   CHOLHDL 4.6 (H) 03/23/2019   Lab Results  Component Value Date   ALT 10 03/23/2019   AST 12 03/23/2019   PLT 280 03/23/2019   TSH 3.830 03/23/2019     She was last seen for this 3 months ago.  Management since that visit includes start Pravastatin 20 mg daily.  She reports poor compliance with treatment. She is having side effects. vomiting  Symptoms: No chest pain No chest pressure/discomfort  No dyspnea No lower extremity edema  No numbness or tingling of extremity No orthopnea  No palpitations No paroxysmal nocturnal dyspnea  No speech difficulty No syncope   Current diet: not asked Current exercise: bicycling  The 10-year ASCVD risk score Mikey Bussing DC Brooke Bonito., et al., 2013) is: 18.8%  ---------------------------------------------------------------------------------------------------   Patient Active Problem List   Diagnosis Date Noted  . Osteoarthritis of left knee 11/20/2017  . Hypercholesteremia 12/10/2014  . CAFL (chronic airflow limitation) ([place]) 02/24/2009  . LBP (low back pain) 05/06/2006  . Acid reflux 10/15/2005  . Adult hypothyroidism 10/15/2005  . Primary localized osteoarthrosis, lower leg 10/15/2005  . Tobacco use 10/15/2005   Social History   Tobacco Use  . Smoking status: Current Every Day Smoker    Packs/day: 1.00    Years: 50.00    Pack years: 50.00    Types: Cigarettes  . Smokeless tobacco: Never Used  Vaping Use  . Vaping Use: Never used  Substance Use Topics  . Alcohol  use: Yes    Comment: Occasionally a couple beers/monthly  . Drug use: No     Medications: Outpatient Medications Prior to Visit  Medication Sig  . albuterol (PROVENTIL HFA;VENTOLIN HFA) 108 (90 BASE) MCG/ACT inhaler Inhale 1 puff into the lungs every 6 (six) hours as needed for wheezing or shortness of breath.   . DM-Doxylamine-Acetaminophen (NYQUIL COLD & FLU PO) Take by mouth as needed (@@ night).  Marland Kitchen DM-Phenylephrine-Acetaminophen (ALKA-SELTZER PLUS DAY COLD/FLU PO) Take by mouth as needed.  Marland Kitchen rOPINIRole (REQUIP) 0.25 MG tablet TAKE 1 TABLET(0.25 MG) BY MOUTH AT BEDTIME  . pravastatin (PRAVACHOL) 20 MG tablet Take 1 tablet (20 mg total) by mouth daily. (Patient not taking: Reported on 06/23/2019)   No facility-administered medications prior to visit.    Review of Systems  Constitutional: Negative.   Respiratory: Negative.   Cardiovascular: Negative.   Gastrointestinal: Negative.       Objective    BP 110/62 (BP Location: Right Arm, Patient Position: Sitting, Cuff Size: Normal)   Pulse 67   Temp (!) 97.1 F (36.2 C) (Temporal)   Resp 16   Wt 138 lb (62.6 kg)   SpO2 97%   BMI 25.24 kg/m  BP Readings from Last 3 Encounters:  06/23/19 110/62  03/23/19 (!) 142/70  09/19/18 110/70   Wt Readings from Last 3 Encounters:  06/23/19 138 lb (62.6 kg)  03/23/19 137 lb (62.1 kg)  09/19/18 134 lb (60.8 kg)     Physical Exam Constitutional:  General: She is not in acute distress.    Appearance: She is well-developed.  HENT:     Head: Normocephalic and atraumatic.     Right Ear: Hearing and tympanic membrane normal.     Left Ear: Hearing and tympanic membrane normal.     Nose: Nose normal.  Eyes:     General: Lids are normal. No scleral icterus.       Right eye: No discharge.        Left eye: No discharge.     Conjunctiva/sclera: Conjunctivae normal.  Cardiovascular:     Rate and Rhythm: Normal rate and regular rhythm.     Heart sounds: Normal heart sounds.    Pulmonary:     Effort: Pulmonary effort is normal. No respiratory distress.     Comments: Intermittent slight wheeze that clears with a cough. Musculoskeletal:        General: Normal range of motion.     Cervical back: Neck supple.  Skin:    Findings: No lesion or rash.  Neurological:     Mental Status: She is alert and oriented to person, place, and time.  Psychiatric:        Speech: Speech normal.        Behavior: Behavior normal.        Thought Content: Thought content normal.       No results found for any visits on 06/23/19.  Assessment & Plan     1. Hypercholesteremia Had to stop the pravastatin due to nausea and vomiting reaction. Continue low fat diet and recheck labs. - Lipid Panel With LDL/HDL Ratio - Comprehensive metabolic panel  2. Tobacco use Still smoking 1 ppd. Counseled regarding need for cessation. Recheck in 4-6 months. - CBC with Differential/Platelet  3. Restless leg syndrome Requip o.25 mg hs working well to control spontaneous leg movements. Recheck labs and continue present dosage. - CBC with Differential/Platelet - Comprehensive metabolic panel   No follow-ups on file.      Andres Shad, PA, have reviewed all documentation for this visit. The documentation on 06/23/19 for the exam, diagnosis, procedures, and orders are all accurate and complete.    [name], [place] [phone] (phone) [phone] (fax)  [place]

## 2019-06-24 LAB — LIPID PANEL WITH LDL/HDL RATIO
Cholesterol, Total: 171 mg/dL (ref 100–199)
HDL: 49 mg/dL (ref >39)
LDL Chol Calc (NIH): 107 mg/dL — ABNORMAL HIGH (ref 0–99)
LDL/HDL Ratio: 2.2 ratio (ref 0.0–3.2)
Triglycerides: 80 mg/dL (ref 0–149)
VLDL Cholesterol Cal: 15 mg/dL (ref 5–40)

## 2019-06-24 LAB — COMPREHENSIVE METABOLIC PANEL
ALT: 22 IU/L (ref 0–32)
AST: 12 IU/L (ref 0–40)
Albumin/Globulin Ratio: 1.5 (ref 1.2–2.2)
Albumin: 4.1 g/dL (ref 3.7–4.7)
Alkaline Phosphatase: 71 IU/L (ref 48–121)
BUN/Creatinine Ratio: 21 (ref 12–28)
BUN: 19 mg/dL (ref 8–27)
Bilirubin Total: 0.3 mg/dL (ref 0.0–1.2)
CO2: 18 mmol/L — ABNORMAL LOW (ref 20–29)
Calcium: 9.4 mg/dL (ref 8.7–10.3)
Chloride: 100 mmol/L (ref 96–106)
Creatinine, Ser: 0.92 mg/dL (ref 0.57–1.00)
GFR calc Af Amer: 70 mL/min/{1.73_m2} (ref >59)
GFR calc non Af Amer: 61 mL/min/{1.73_m2} (ref >59)
Globulin, Total: 2.8 g/dL (ref 1.5–4.5)
Glucose: 78 mg/dL (ref 65–99)
Potassium: 4.9 mmol/L (ref 3.5–5.2)
Sodium: 135 mmol/L (ref 134–144)
Total Protein: 6.9 g/dL (ref 6.0–8.5)

## 2019-06-24 LAB — CBC WITH DIFFERENTIAL/PLATELET
Basophils Absolute: 0.1 10*3/uL (ref 0.0–0.2)
Basos: 1 %
EOS (ABSOLUTE): 0.2 10*3/uL (ref 0.0–0.4)
Eos: 2 %
Hematocrit: 47.2 % — ABNORMAL HIGH (ref 34.0–46.6)
Hemoglobin: 15.7 g/dL (ref 11.1–15.9)
Immature Grans (Abs): 0.2 10*3/uL — ABNORMAL HIGH (ref 0.0–0.1)
Immature Granulocytes: 2 %
Lymphocytes Absolute: 5.3 10*3/uL — ABNORMAL HIGH (ref 0.7–3.1)
Lymphs: 36 %
MCH: 28.6 pg (ref 26.6–33.0)
MCHC: 33.3 g/dL (ref 31.5–35.7)
MCV: 86 fL (ref 79–97)
Monocytes Absolute: 1.4 10*3/uL — ABNORMAL HIGH (ref 0.1–0.9)
Monocytes: 9 %
Neutrophils Absolute: 7.3 10*3/uL — ABNORMAL HIGH (ref 1.4–7.0)
Neutrophils: 50 %
Platelets: 343 10*3/uL (ref 150–450)
RBC: 5.49 x10E6/uL — ABNORMAL HIGH (ref 3.77–5.28)
RDW: 13.2 % (ref 11.7–15.4)
WBC: 14.6 10*3/uL — ABNORMAL HIGH (ref 3.4–10.8)

## 2019-06-25 ENCOUNTER — Telehealth: Payer: Self-pay

## 2019-06-25 NOTE — Telephone Encounter (Signed)
Tried calling; number is ringing busy.  PEC please advise pt of lab results if she calls back.      Thanks,  -Mickel Baas

## 2019-06-25 NOTE — Telephone Encounter (Signed)
-----   Message from Margo Common, [place] sent at 06/25/2019  3:16 PM EDT ----- All blood tests essentially normal except WBC count and RBC counts are high. This may be due to chronic smoking or impending infection. Call or return if any fever or congestion. Recheck CBC in a month to be sure it goes back down.

## 2019-06-26 NOTE — Telephone Encounter (Signed)
Phone call to pt.  Advised of Result note per [name] from 6/17.  Advised pt. To note on calendar to return to lab in one month for a recheck of her CBC.  Pt. encouraged to call office with onset of fever or congestion. Verb. Understanding.

## 2019-07-23 IMAGING — DX DG KNEE COMPLETE 4+V*L*
4 series · 4 of 4 positions shown · non-contrast
Comparison: None.

CLINICAL DATA: Worsening left knee pain over the past 3 days.
Remote history of right knee surgery.

EXAM:
LEFT KNEE - COMPLETE 4+ VIEW

[knee ap]
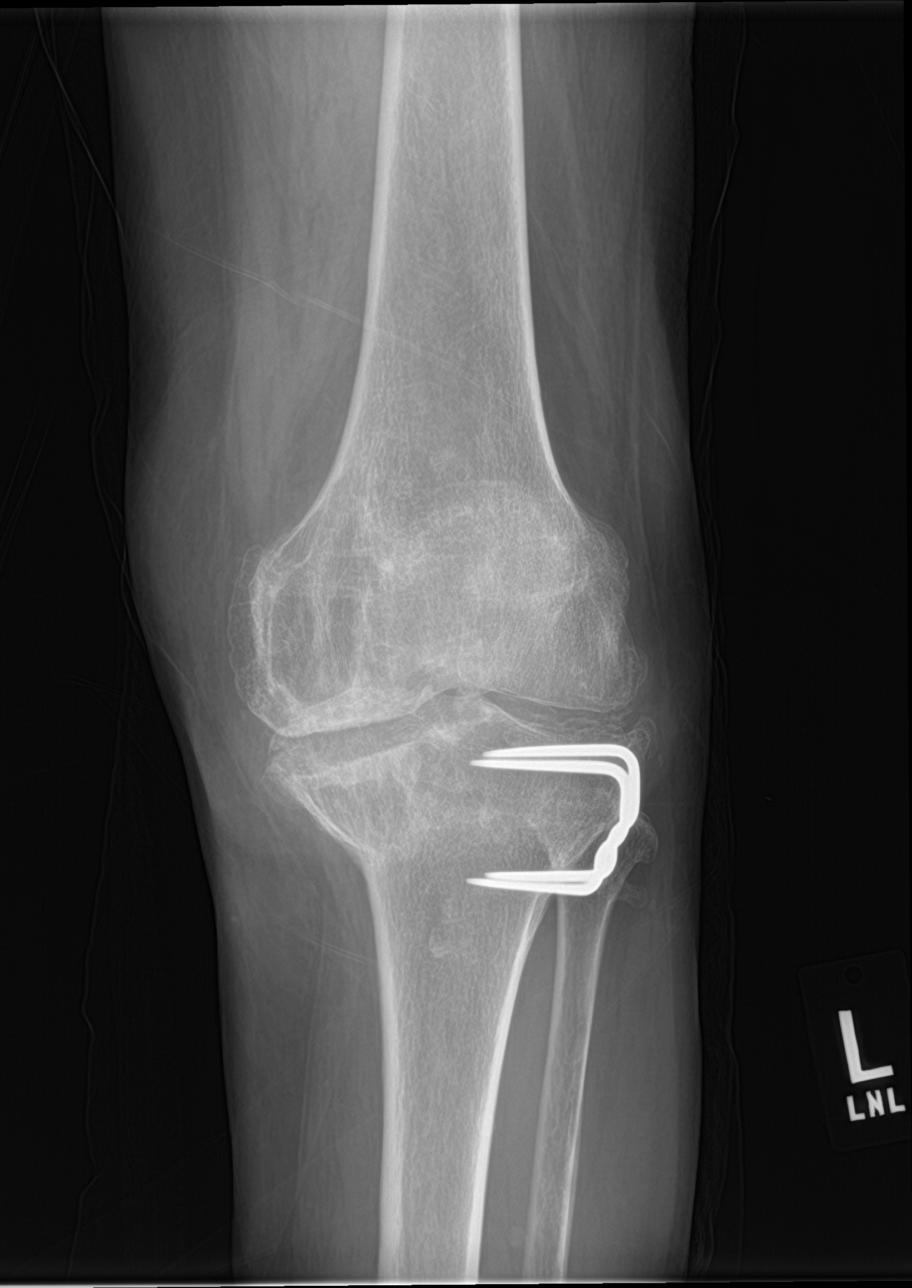

[knee lat]
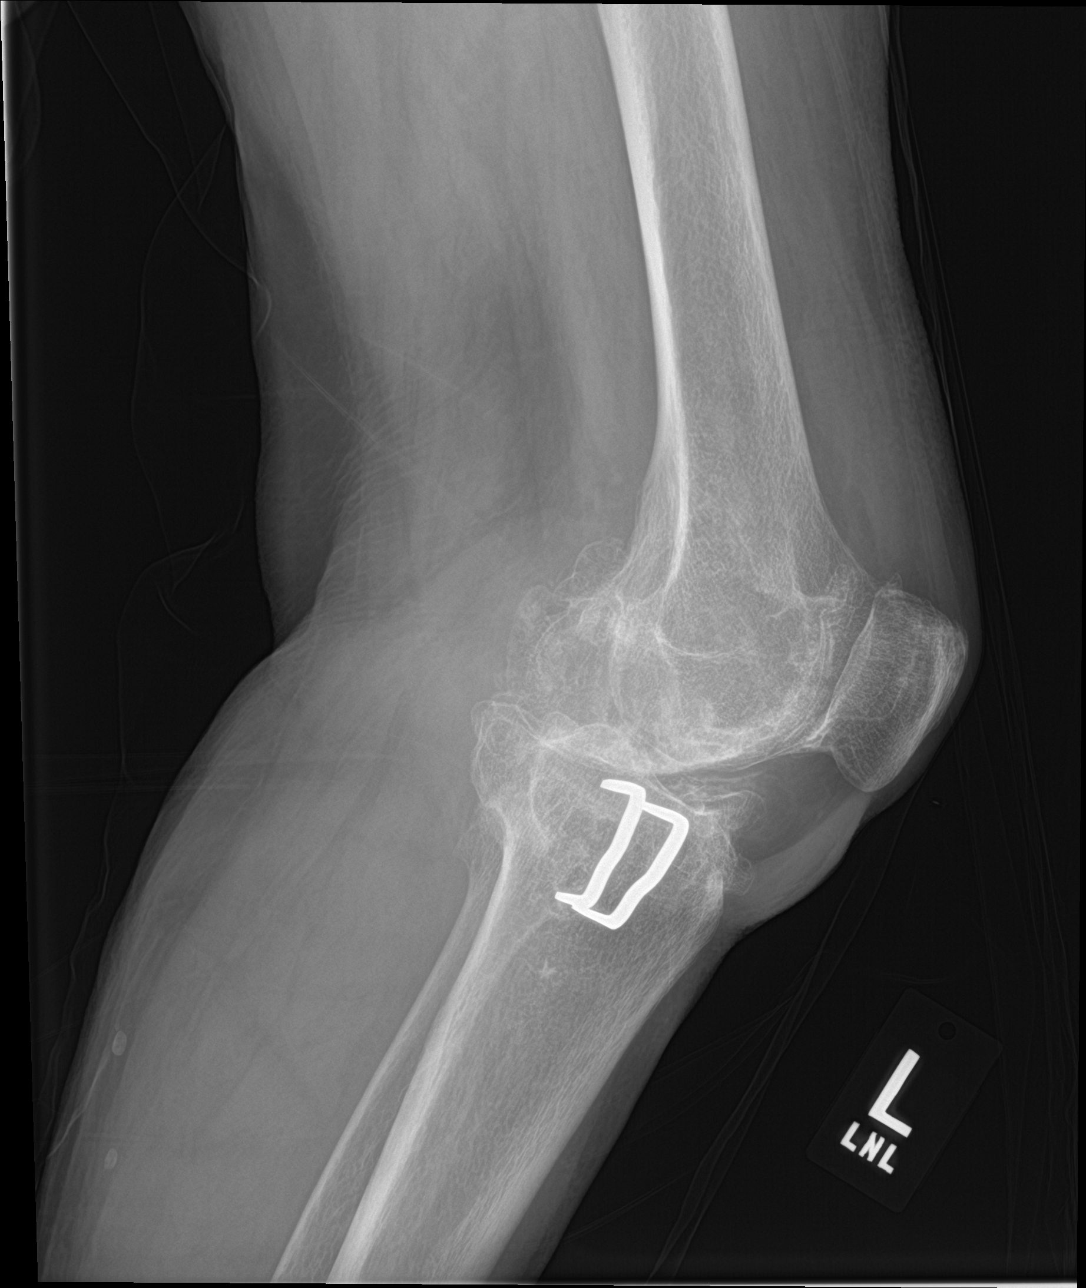

[knee obl (1 of 2)]
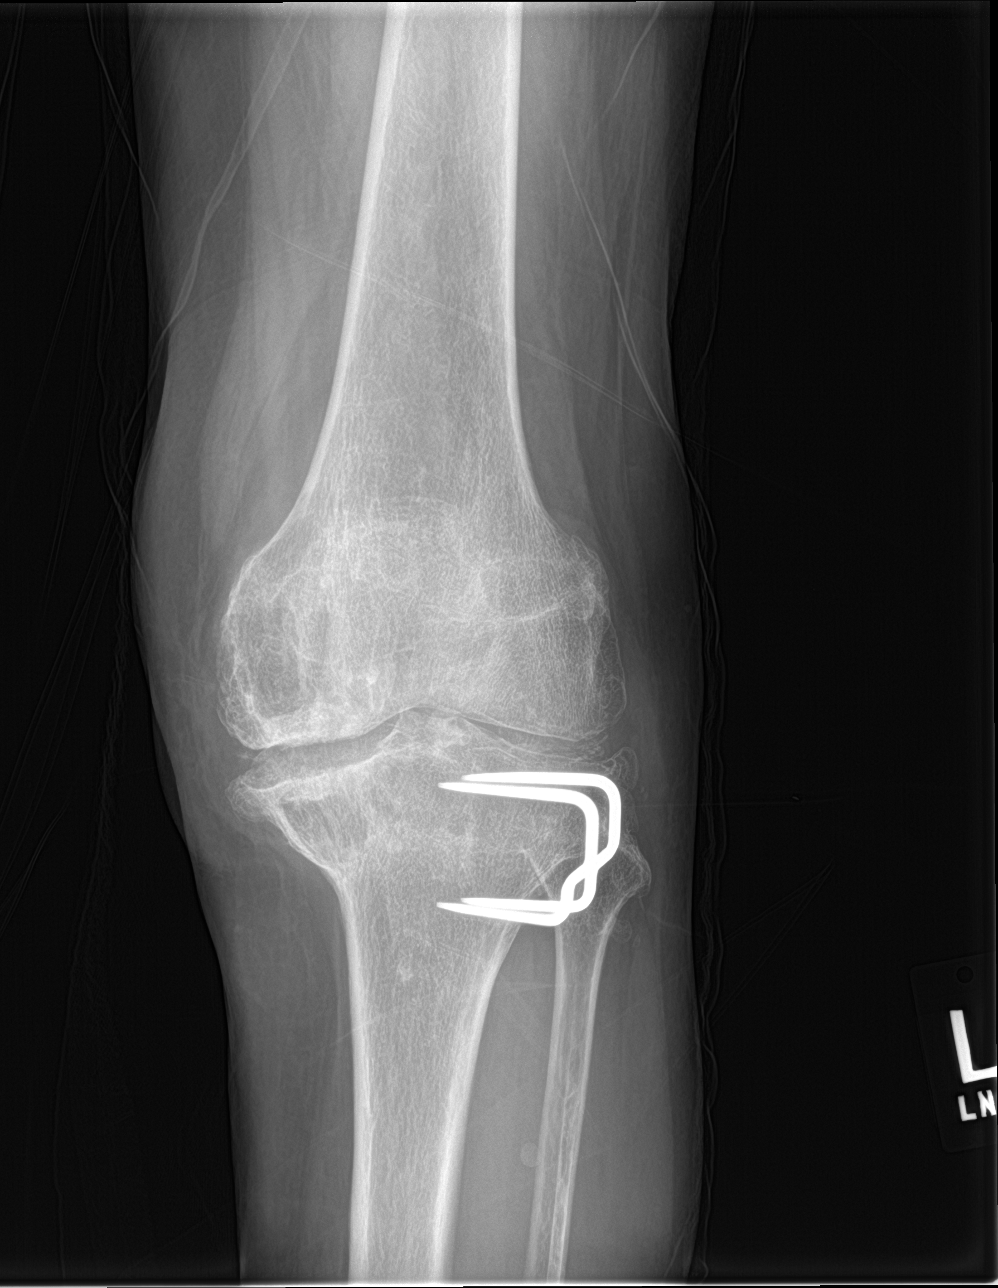

[knee obl (2 of 2)]
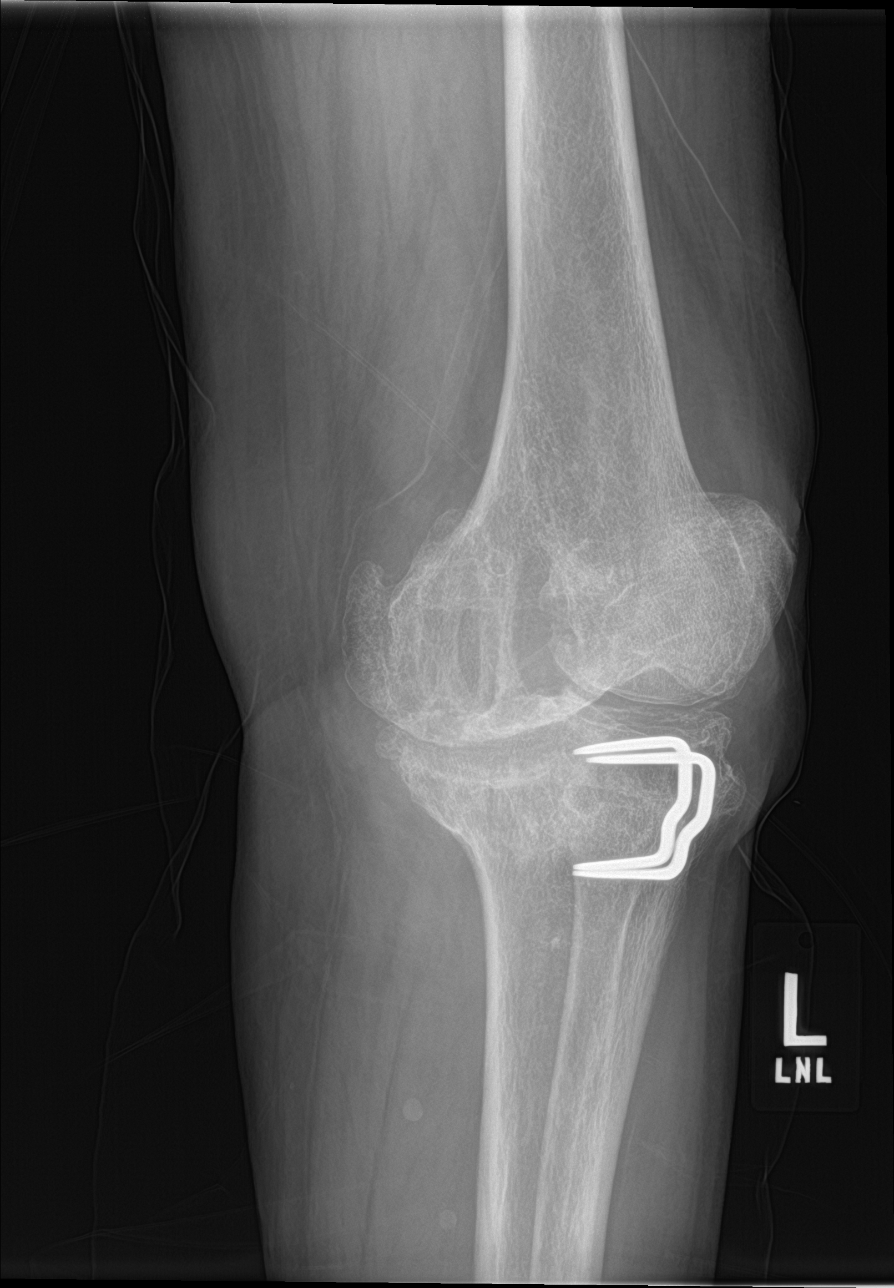

[4 of 4 positions shown; findings below may reference images not displayed]

FINDINGS: There is no acute bony or joint abnormality. 2 bone staples are seen
in the lateral tibial plateau. The patient has severe
tricompartmental osteoarthritis, worst medially. No joint effusion.
Chondrocalcinosis noted.
IMPRESSION: No acute abnormality.

Advanced tricompartmental osteoarthritis.

Postoperative change medial tibial plateau.

Chondrocalcinosis.

## 2019-10-03 IMAGING — DX DG KNEE 1-2V PORT*L*
2 series · 2 of 2 positions shown · non-contrast
Comparison: 09/09/2017

CLINICAL DATA: Postop left knee replacement

EXAM:
PORTABLE LEFT KNEE - 1-2 VIEW

[knee ap]
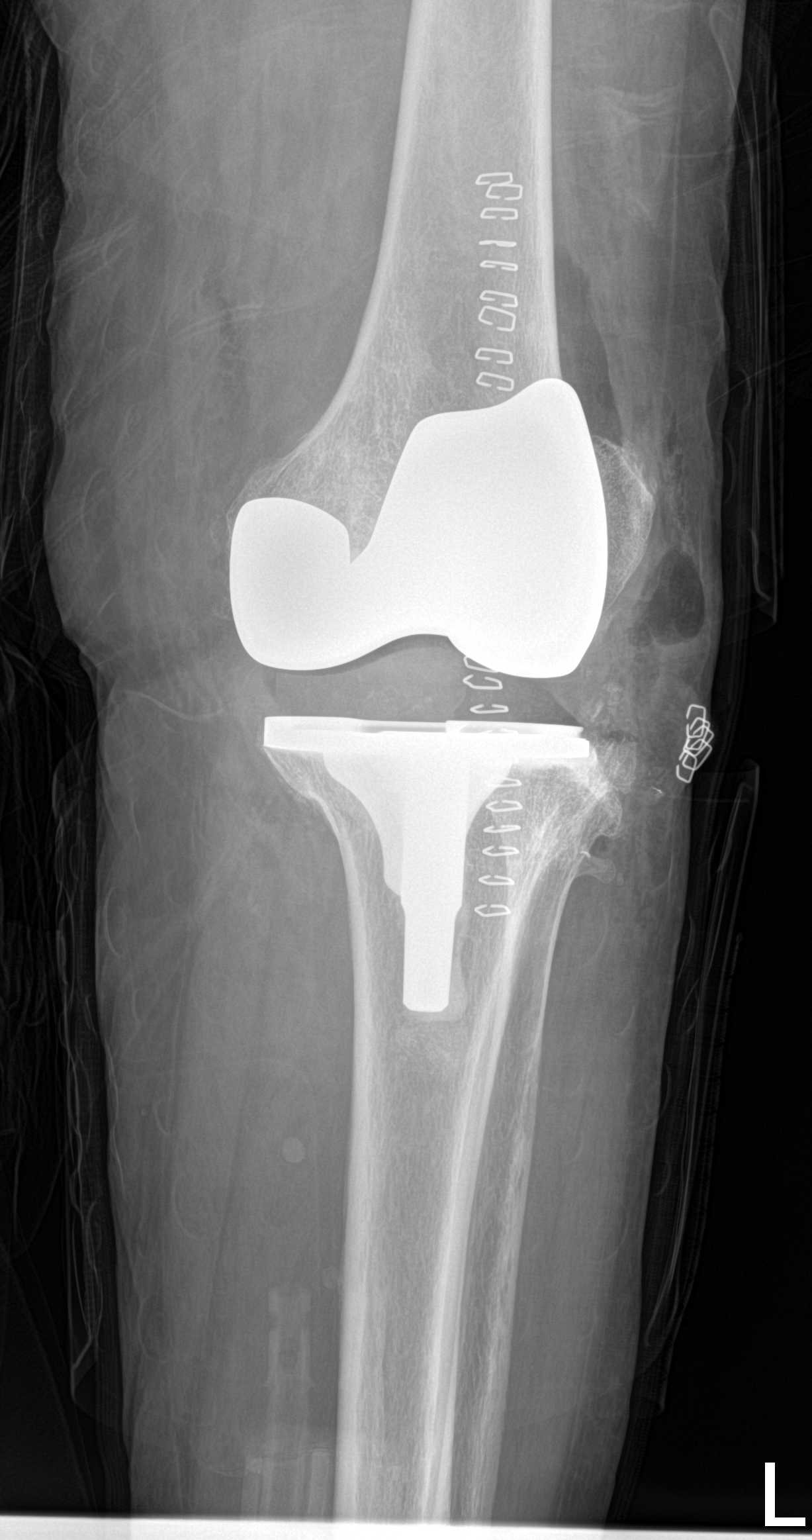

[knee lat]
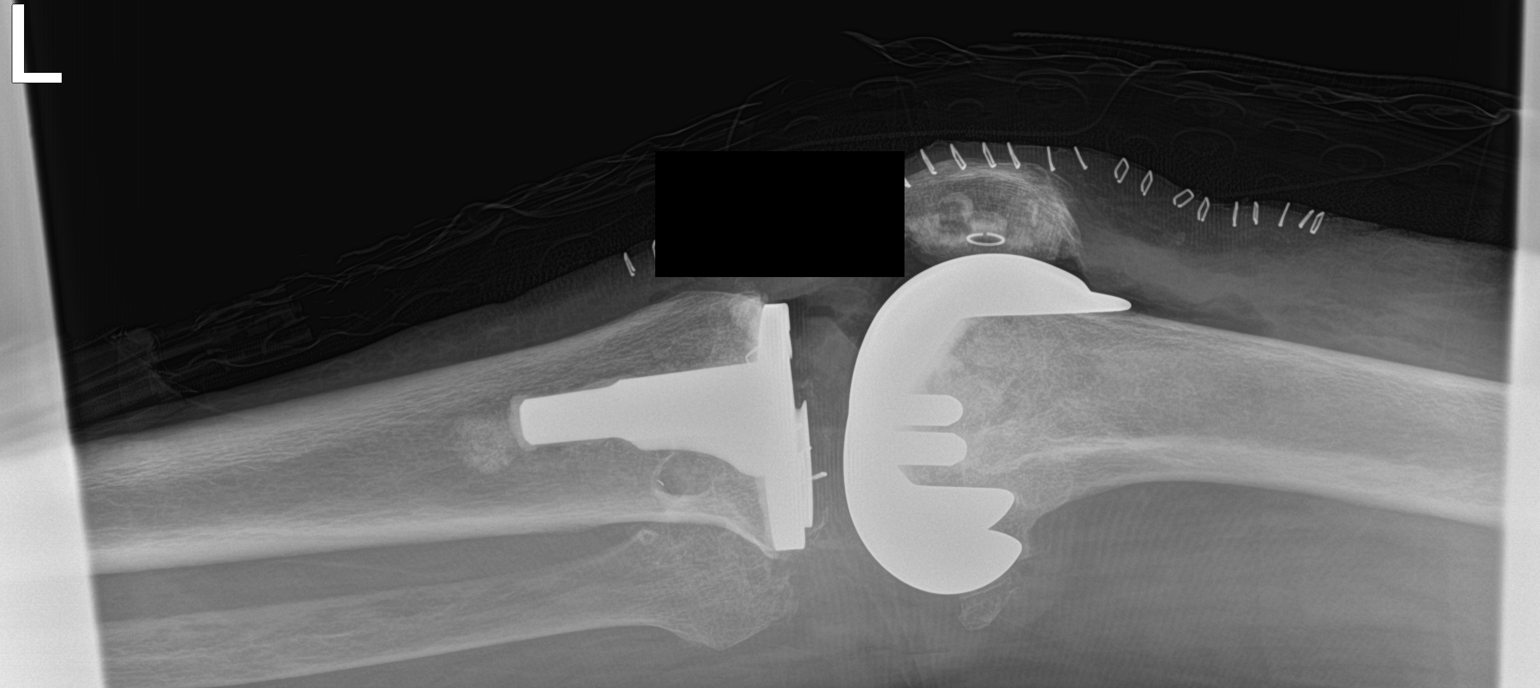

[2 of 2 positions shown; findings below may reference images not displayed]

FINDINGS: Total knee replacement left in satisfactory position alignment. No
immediate complication
IMPRESSION: Satisfactory left knee replacement

## 2019-10-15 ENCOUNTER — Other Ambulatory Visit: Payer: Self-pay

## 2019-10-15 ENCOUNTER — Encounter: Payer: Self-pay | Admitting: Family Medicine

## 2019-10-15 ENCOUNTER — Ambulatory Visit (INDEPENDENT_AMBULATORY_CARE_PROVIDER_SITE_OTHER): Payer: Medicare Other | Admitting: Family Medicine

## 2019-10-15 VITALS — BP 112/64 | HR 73 | Temp 97.8°F | Resp 18 | Wt 135.0 lb

## 2019-10-15 DIAGNOSIS — G2581 Restless legs syndrome: Secondary | ICD-10-CM | POA: Diagnosis not present

## 2019-10-15 DIAGNOSIS — Z720 Tobacco use: Secondary | ICD-10-CM | POA: Diagnosis not present

## 2019-10-15 DIAGNOSIS — E039 Hypothyroidism, unspecified: Secondary | ICD-10-CM

## 2019-10-15 DIAGNOSIS — Z23 Encounter for immunization: Secondary | ICD-10-CM

## 2019-10-15 NOTE — Progress Notes (Signed)
Established patient visit   Patient: Katie Carter   DOB: 1945-08-03   74 y.o. Female  MRN: [id number] Visit Date: 10/15/2019  Today's healthcare provider: [name], PA   Chief Complaint  Patient presents with   Hypothyroidism   Follow-up   Subjective    HPI  Hypothyroid, follow-up  Lab Results  Component Value Date   TSH 3.830 03/23/2019   T4TOTAL 7.5 03/23/2019   Wt Readings from Last 3 Encounters:  10/15/19 135 lb (61.2 kg)  06/23/19 138 lb (62.6 kg)  03/23/19 137 lb (62.1 kg)    She was last seen for hypothyroid 7 months ago.  No changes were made during that visit. She reports good compliance with treatment. She is not having side effects.   Symptoms: No change in energy level No constipation  No diarrhea No heat / cold intolerance  No nervousness No palpitations  No weight changes    -----------------------------------------------------------------------------------------  Follow up for restless legs:  The patient was last seen for this 4 months ago. Changes made at last visit include none. Labs were ordered. All blood tests essentially normal except high WBC count and RBC counts. This may be due to chronic smoking or impending infection. Call or return if any fever or congestion. Patient was advised to recheck CBC in a month to be sure it goes back down.  She reports good compliance with treatment. She feels that condition is Unchanged. She is not having side effects.   -----------------------------------------------------------------------------------------   Past Medical History:  Diagnosis Date   Arthritis    Chronic airway obstruction ([place])    Dyspnea    Hypercholesteremia    Lumbago 05/06/2006   Thyroid disease 10/15/2005   Tobacco use disorder    Past Surgical History:  Procedure Laterality Date   ABDOMINAL HYSTERECTOMY  1960   abdominal Hysterectomy with bilateral salpingo-oophorectomy   APPENDECTOMY     CATARACT  EXTRACTION Right 10/2012   Surgical Fixation  1981   of the left knee fracture   TOTAL KNEE ARTHROPLASTY Left 11/20/2017   Procedure: TOTAL KNEE ARTHROPLASTY hardware removal;  Surgeon: Lovell Sheehan, MD;  Location: [facility] ORS;  Service: Orthopedics;  Laterality: Left;   Social History   Tobacco Use   Smoking status: Current Every Day Smoker    Packs/day: 1.00    Years: 50.00    Pack years: 50.00    Types: Cigarettes   Smokeless tobacco: Never Used  Vaping Use   Vaping Use: Never used  Substance Use Topics   Alcohol use: Yes    Comment: Occasionally a couple beers/monthly   Drug use: No   Family Status  Relation Name Status   Mother  Deceased   Father  Deceased   Sister  Alive   Brother  Deceased   Brother  Deceased   Daughter  Deceased   Daughter  Alive   Allergies  Allergen Reactions   Aspirin     vomiting   Meloxicam Other (See Comments)    Severe chest pain   Tramadol Rash       Medications: Outpatient Medications Prior to Visit  Medication Sig   albuterol (PROVENTIL HFA;VENTOLIN HFA) 108 (90 BASE) MCG/ACT inhaler Inhale 1 puff into the lungs every 6 (six) hours as needed for wheezing or shortness of breath.    rOPINIRole (REQUIP) 0.25 MG tablet TAKE 1 TABLET(0.25 MG) BY MOUTH AT BEDTIME   DM-Doxylamine-Acetaminophen (NYQUIL COLD & FLU PO) Take by mouth as needed (@@  night). (Patient not taking: Reported on 10/15/2019)   DM-Phenylephrine-Acetaminophen (ALKA-SELTZER PLUS DAY COLD/FLU PO) Take by mouth as needed. (Patient not taking: Reported on 10/15/2019)   No facility-administered medications prior to visit.    Review of Systems  Constitutional: Negative for appetite change, chills, fatigue and fever.  Respiratory: Negative for chest tightness and shortness of breath.   Cardiovascular: Negative for chest pain and palpitations.  Gastrointestinal: Negative for abdominal pain, nausea and vomiting.  Musculoskeletal: Positive for arthralgias (left knee pain).   Neurological: Negative for dizziness and weakness.    Last CBC Lab Results  Component Value Date   WBC 14.6 (H) 06/23/2019   HGB 15.7 06/23/2019   HCT 47.2 (H) 06/23/2019   MCV 86 06/23/2019   MCH 28.6 06/23/2019   RDW 13.2 06/23/2019   PLT 343 06/23/2019      Objective    BP 112/64 (BP Location: Right Arm, Patient Position: Sitting, Cuff Size: Normal)   Pulse 73   Temp 97.8 F (36.6 C) (Oral)   Resp 18   Wt 135 lb (61.2 kg)   SpO2 96%   BMI 24.69 kg/m  BP Readings from Last 3 Encounters:  10/15/19 112/64  06/23/19 110/62  03/23/19 (!) 142/70     Physical Exam Constitutional:      General: She is not in acute distress.    Appearance: She is well-developed.  HENT:     Head: Normocephalic and atraumatic.     Right Ear: Hearing normal.     Left Ear: Hearing normal.     Nose: Nose normal.  Eyes:     General: Lids are normal. No scleral icterus.       Right eye: No discharge.        Left eye: No discharge.     Conjunctiva/sclera: Conjunctivae normal.  Cardiovascular:     Rate and Rhythm: Normal rate and regular rhythm.     Pulses: Normal pulses.     Heart sounds: Normal heart sounds.  Pulmonary:     Effort: Pulmonary effort is normal. No respiratory distress.     Breath sounds: Normal breath sounds.  Abdominal:     General: Bowel sounds are normal.     Palpations: Abdomen is soft.  Musculoskeletal:        General: Normal range of motion.     Cervical back: Neck supple.  Skin:    Findings: No lesion or rash.  Neurological:     Mental Status: She is alert and oriented to person, place, and time.  Psychiatric:        Speech: Speech normal.        Behavior: Behavior normal.        Thought Content: Thought content normal.       No results found for any visits on 10/15/19.  Assessment & Plan     1. Adult hypothyroidism No heat or cold intolerance. No exophthalmos, hair loss, significant weight changes, brittle nails or myxedema. No thyroxine  supplements. Will recheck RBC and WBC changes noted on last labs in June 2021. - CBC with Differential/Platelet - TSH  2. Tobacco use Still smoking 1 ppd. No dyspnea or wheezing. Encouraged to work on smoking cessation.  3. Restless leg syndrome Well controlled with use of Requip 0.25 mg hs and using an exercise pedal apparatus. Sleep well and much less restlessness in legs. Recheck CBC and TSH. - CBC with Differential/Platelet - TSH  4. Need for influenza vaccination - Flu Vaccine QUAD High Dose  IM (Fluad)   No follow-ups on file.     Andres Shad, PA, have reviewed all documentation for this visit. The documentation on 10/15/19 for the exam, diagnosis, procedures, and orders are all accurate and complete.    [name], [place] [phone] (phone) [phone] (fax)  [place]

## 2019-10-16 LAB — CBC WITH DIFFERENTIAL/PLATELET
Basophils Absolute: 0.2 10*3/uL (ref 0.0–0.2)
Basos: 2 %
EOS (ABSOLUTE): 0.2 10*3/uL (ref 0.0–0.4)
Eos: 2 %
Hematocrit: 45.8 % (ref 34.0–46.6)
Hemoglobin: 15.6 g/dL (ref 11.1–15.9)
Immature Grans (Abs): 0 10*3/uL (ref 0.0–0.1)
Immature Granulocytes: 0 %
Lymphocytes Absolute: 3.9 10*3/uL — ABNORMAL HIGH (ref 0.7–3.1)
Lymphs: 38 %
MCH: 29.1 pg (ref 26.6–33.0)
MCHC: 34.1 g/dL (ref 31.5–35.7)
MCV: 85 fL (ref 79–97)
Monocytes Absolute: 0.8 10*3/uL (ref 0.1–0.9)
Monocytes: 8 %
Neutrophils Absolute: 5.2 10*3/uL (ref 1.4–7.0)
Neutrophils: 50 %
Platelets: 255 10*3/uL (ref 150–450)
RBC: 5.36 x10E6/uL — ABNORMAL HIGH (ref 3.77–5.28)
RDW: 13 % (ref 11.7–15.4)
WBC: 10.2 10*3/uL (ref 3.4–10.8)

## 2019-10-16 LAB — TSH: TSH: 3.18 u[IU]/mL (ref 0.450–4.500)

## 2020-01-15 ENCOUNTER — Telehealth: Payer: Self-pay

## 2020-01-15 NOTE — Telephone Encounter (Signed)
Copied from [place] [id number]. Topic: General - Other >> Jan 15, 2020  9:21 AM Hinda Lenis D wrote: PT need an appt for her booster shot / second doze 04/19/19 / please advise

## 2020-01-20 ENCOUNTER — Ambulatory Visit (INDEPENDENT_AMBULATORY_CARE_PROVIDER_SITE_OTHER): Payer: Medicare Other

## 2020-01-20 ENCOUNTER — Other Ambulatory Visit: Payer: Self-pay

## 2020-01-20 DIAGNOSIS — Z23 Encounter for immunization: Secondary | ICD-10-CM

## 2020-03-20 ENCOUNTER — Other Ambulatory Visit: Payer: Self-pay | Admitting: Family Medicine

## 2020-03-21 NOTE — Progress Notes (Addendum)
Subjective:   Katie Carter is a 75 y.o. female who presents for Medicare Annual (Subsequent) preventive examination.  Review of Systems    N/A  Cardiac Risk Factors include: advanced age (>68men, >31 women);smoking/ tobacco exposure     Objective:    Today's Vitals   03/22/20 0946  BP: 118/62  Pulse: 69  Temp: 97.7 F (36.5 C)  TempSrc: Oral  SpO2: 96%  Weight: 139 lb 6.4 oz (63.2 kg)  Height: 5\' 2"  (1.575 m)  PainSc: 0-No pain   Body mass index is 25.5 kg/m.  Advanced Directives 03/22/2020 03/19/2019 03/18/2018 11/20/2017 11/20/2017 11/11/2017 09/09/2017  Does Patient Have a Medical Advance Directive? No No No - No No No  Would patient like information on creating a medical advance directive? No - Patient declined No - Patient declined No - Patient declined No - Patient declined - No - Patient declined No - Patient declined    Current Medications (verified) Outpatient Encounter Medications as of 03/22/2020  Medication Sig   albuterol (PROVENTIL HFA;VENTOLIN HFA) 108 (90 BASE) MCG/ACT inhaler Inhale 1 puff into the lungs every 6 (six) hours as needed for wheezing or shortness of breath.    guaifenesin (ROBITUSSIN) 100 MG/5ML syrup Take 200 mg by mouth 3 (three) times daily as needed for cough.   rOPINIRole (REQUIP) 0.25 MG tablet TAKE 1 TABLET(0.25 MG) BY MOUTH AT BEDTIME   DM-Doxylamine-Acetaminophen (NYQUIL COLD & FLU PO) Take by mouth as needed (@@ night). (Patient not taking: No sig reported)   DM-Phenylephrine-Acetaminophen (ALKA-SELTZER PLUS DAY COLD/FLU PO) Take by mouth as needed. (Patient not taking: No sig reported)   No facility-administered encounter medications on file as of 03/22/2020.    Allergies (verified) Aspirin, Meloxicam, and Tramadol   History: Past Medical History:  Diagnosis Date   Arthritis    Chronic airway obstruction ([place])    Dyspnea    Hypercholesteremia    Lumbago 05/06/2006   Thyroid disease 10/15/2005   Tobacco use disorder    Past  Surgical History:  Procedure Laterality Date   ABDOMINAL HYSTERECTOMY  1960   abdominal Hysterectomy with bilateral salpingo-oophorectomy   APPENDECTOMY     CATARACT EXTRACTION Right 10/2012   Surgical Fixation  1981   of the left knee fracture   TOTAL KNEE ARTHROPLASTY Left 11/20/2017   Procedure: TOTAL KNEE ARTHROPLASTY hardware removal;  Surgeon: Lovell Sheehan, MD;  Location: [facility] ORS;  Service: Orthopedics;  Laterality: Left;   Family History  Problem Relation Age of Onset   Alzheimer's disease Mother    Cancer Brother    Seizures Daughter    Social History   Socioeconomic History   Marital status: Significant Other    Spouse name: divorced   Number of children: 2   Years of education: Not on file   Highest education level: 9th grade  Occupational History   Occupation: retired  Tobacco Use   Smoking status: Current Every Day Smoker    Packs/day: 1.00    Years: 50.00    Pack years: 50.00    Types: Cigarettes   Smokeless tobacco: Never Used   Tobacco comment: "Has been thinking about quitting" 03/2020  Vaping Use   Vaping Use: Never used  Substance and Sexual Activity   Alcohol use: Yes    Comment: Occasionally a couple beers/monthly   Drug use: No   Sexual activity: Not on file  Other Topics Concern   Not on file  Social History Narrative   1 daughter living and  has 1 deceased.   Social Determinants of Health   Financial Resource Strain: Low Risk    Difficulty of Paying Living Expenses: Not hard at all  Food Insecurity: No Food Insecurity   Worried About Charity fundraiser in the Last Year: Never true   [place] in the Last Year: Never true  Transportation Needs: No Transportation Needs   Lack of Transportation (Medical): No   Lack of Transportation (Non-Medical): No  Physical Activity: Inactive   Days of Exercise per Week: 0 days   Minutes of Exercise per Session: 0 min  Stress: No Stress Concern Present   Feeling of Stress : Not at all   Social Connections: Socially Isolated   Frequency of Communication with Friends and Family: Once a week   Frequency of Social Gatherings with Friends and Family: Once a week   Attends Religious Services: Never   Marine scientist or Organizations: No   Attends Music therapist: Never   Marital Status: Living with partner    Tobacco Counseling Ready to quit: Yes Counseling given: No Comment: "Has been thinking about quitting" 03/2020   Clinical Intake:  Pre-visit preparation completed: Yes  Pain : No/denies pain Pain Score: 0-No pain     Nutritional Status: BMI 25 -29 Overweight Nutritional Risks: None Diabetes: No  How often do you need to have someone help you when you read instructions, pamphlets, or other written materials from your doctor or pharmacy?: 1 - Never  Diabetic? No  Interpreter Needed?: No  Information entered by :: [facility], LPN   Activities of Daily Living In your present state of health, do you have any difficulty performing the following activities: 03/22/2020  Hearing? N  Vision? N  Difficulty concentrating or making decisions? N  Walking or climbing stairs? N  Dressing or bathing? N  Doing errands, shopping? N  Preparing Food and eating ? N  Using the Toilet? N  In the past six months, have you accidently leaked urine? N  Do you have problems with loss of bowel control? N  Managing your Medications? N  Managing your Finances? N  Housekeeping or managing your Housekeeping? N  Some recent data might be hidden    Patient Care Team: Chrismon, Vickki Muff, PA-C as PCP - General (Family Medicine)  Indicate any recent Medical Services you may have received from other than [facility] providers in the past year (date may be approximate).     Assessment:   This is a routine wellness examination for Katie Carter.  Hearing/Vision screen No exam data present  Dietary issues and exercise activities discussed: Current Exercise Habits: The  patient does not participate in regular exercise at present, Exercise limited by: None identified  Goals      DIET - INCREASE WATER INTAKE     Recommend to drink at least 6-8 8oz glasses of water per day.     Exercise 3x per week (30 min per time)     Recommend to start exercising 3 days a week for at least minutes a time. Pt to start exercising when the weather warms up.      Quit Smoking     Recommend to continue efforts to reduce smoking habits until no longer smoking (Smoking Cessation literature attached to AVS).         Depression Screen PHQ 2/9 Scores 03/22/2020 03/19/2019 03/18/2018 11/05/2017 03/12/2017 03/12/2017 02/23/2016  PHQ - 2 Score 0 0 0 0 0 0 0  PHQ- 9  Score - - - - 0 - -    Fall Risk Fall Risk  03/22/2020 03/19/2019 03/18/2018 11/05/2017 03/12/2017  Falls in the past year? 0 0 0 No No  Number falls in past yr: 0 0 - - -  Injury with Fall? 0 0 - - -    FALL RISK PREVENTION PERTAINING TO THE HOME:  Any stairs in or around the home? Yes  If so, are there any without handrails? No  Home free of loose throw rugs in walkways, pet beds, electrical cords, etc? Yes  Adequate lighting in your home to reduce risk of falls? Yes   ASSISTIVE DEVICES UTILIZED TO PREVENT FALLS:  Life alert? No  Use of a cane, walker or w/c? No  Grab bars in the bathroom? No  Shower chair or bench in shower? Yes  Elevated toilet seat or a handicapped toilet? Yes   TIMED UP AND GO:  Was the test performed? Yes .  Length of time to ambulate 10 feet: 8 sec.   Gait steady and fast without use of assistive device  Cognitive Function: Normal cognitive status assessed by direct observation by this Nurse Health Advisor. No abnormalities found.          Immunizations Immunization History  Administered Date(s) Administered   Fluad Quad(high Dose 65+) 10/15/2019   Influenza, High Dose Seasonal PF 10/02/2016, 11/05/2017   PFIZER(Purple Top)SARS-COV-2 Vaccination 03/29/2019, 04/19/2019,  01/20/2020   Pneumococcal Conjugate-13 10/02/2016   Pneumococcal Polysaccharide-23 11/05/2017    TDAP status: Due, Education has been provided regarding the importance of this vaccine. Advised may receive this vaccine at local pharmacy or Health Dept. Aware to provide a copy of the vaccination record if obtained from local pharmacy or Health Dept. Verbalized acceptance and understanding.  Flu Vaccine status: Up to date  Pneumococcal vaccine status: Up to date  Covid-19 vaccine status: Completed vaccines  Qualifies for Shingles Vaccine? Yes   Zostavax completed No   Shingrix Completed?: No.    Education has been provided regarding the importance of this vaccine. Patient has been advised to call insurance company to determine out of pocket expense if they have not yet received this vaccine. Advised may also receive vaccine at local pharmacy or Health Dept. Verbalized acceptance and understanding.  Screening Tests Health Maintenance  Topic Date Due   DEXA SCAN  03/01/2016   TETANUS/TDAP  03/22/2021 (Originally 01/25/1964)   INFLUENZA VACCINE  Completed   COVID-19 Vaccine  Completed   Hepatitis C Screening  Completed   PNA vac Low Risk Adult  Completed   HPV VACCINES  Aged Out    Health Maintenance  Health Maintenance Due  Topic Date Due   DEXA SCAN  03/01/2016    Colorectal cancer screening: No longer required.   Mammogram status: No longer required due to age.  Bone Density status: Completed 03/01/14. Results reflect: Bone density results: OSTEOPOROSIS. Repeat every 2 years. Declined order at this time.   Lung Cancer Screening: (Low Dose CT Chest recommended if Age 38-80 years, 30 pack-year currently smoking OR have quit w/in 15years.) does qualify however declines order at this time.   Additional Screening:  Hepatitis C Screening: Up to date  Vision Screening: Recommended annual ophthalmology exams for early detection of glaucoma and other disorders of the eye. Is the  patient up to date with their annual eye exam? No Who is the provider or what is the name of the office in which the patient attends annual eye exams? n/a If  pt is not established with a provider, would they like to be referred to a provider to establish care? No .   Dental Screening: Recommended annual dental exams for proper oral hygiene  Community Resource Referral / Chronic Care Management: CRR required this visit?  No   CCM required this visit?  No      Plan:     I have personally reviewed and noted the following in the patient's chart:   Medical and social history Use of alcohol, tobacco or illicit drugs  Current medications and supplements Functional ability and status Nutritional status Physical activity Advanced directives List of other physicians Hospitalizations, surgeries, and ER visits in previous 12 months Vitals Screenings to include cognitive, depression, and falls Referrals and appointments  In addition, I have reviewed and discussed with patient certain preventive protocols, quality metrics, and best practice recommendations. A written personalized care plan for preventive services as well as general preventive health recommendations were provided to patient.     McKenzie Broad[place], [place]   [id number]   Nurse Notes: Pt declined a DEXA order and chest CT scan (due to smoking hx) at this time.   Reviewed note and plan of Nurse Health Advisor screening. Agree with documentation and recommendations.

## 2020-03-22 ENCOUNTER — Ambulatory Visit (INDEPENDENT_AMBULATORY_CARE_PROVIDER_SITE_OTHER): Payer: Medicare Other

## 2020-03-22 ENCOUNTER — Other Ambulatory Visit: Payer: Self-pay

## 2020-03-22 VITALS — BP 118/62 | HR 69 | Temp 97.7°F | Ht 62.0 in | Wt 139.4 lb

## 2020-03-22 DIAGNOSIS — Z0000 Encounter for general adult medical examination without abnormal findings: Secondary | ICD-10-CM

## 2020-03-22 NOTE — Patient Instructions (Signed)
Katie Carter , Thank you for taking time to come for your Medicare Wellness Visit. I appreciate your ongoing commitment to your health goals. Please review the following plan we discussed and let me know if I can assist you in the future.   Screening recommendations/referrals: Colonoscopy: No longer required.  Mammogram: No longer required.  Bone Density: Currently due, declined order at this time. Recommended yearly ophthalmology/optometry visit for glaucoma screening and checkup Recommended yearly dental visit for hygiene and checkup  Vaccinations: Influenza vaccine: Done 10/15/19 Pneumococcal vaccine: Completed series Tdap vaccine: Currently due, declined receiving.  Shingles vaccine: Shingrix discussed. Please contact your pharmacy for coverage information.     Advanced directives: Advance directive discussed with you today. Even though you declined this today please call our office should you change your mind and we can give you the proper paperwork for you to fill out.  Conditions/risks identified: Smoking cessation discussed. Recommend to start exercising 3 days a week for 30 minutes at a time. Also recommend to increase water intake to 6-8 8 oz glasses a day.   Next appointment: 04/25/20 @ 10:20 AM with [name]. Declined scheduling an AWV for 2023 at this time.    Preventive Care 30 Years and Older, Female Preventive care refers to lifestyle choices and visits with your health care provider that can promote health and wellness. What does preventive care include?  A yearly physical exam. This is also called an annual well check.  Dental exams once or twice a year.  Routine eye exams. Ask your health care provider how often you should have your eyes checked.  Personal lifestyle choices, including:  Daily care of your teeth and gums.  Regular physical activity.  Eating a healthy diet.  Avoiding tobacco and drug use.  Limiting alcohol use.  Practicing safe  sex.  Taking low-dose aspirin every day.  Taking vitamin and mineral supplements as recommended by your health care provider. What happens during an annual well check? The services and screenings done by your health care provider during your annual well check will depend on your age, overall health, lifestyle risk factors, and family history of disease. Counseling  Your health care provider may ask you questions about your:  Alcohol use.  Tobacco use.  Drug use.  Emotional well-being.  Home and relationship well-being.  Sexual activity.  Eating habits.  History of falls.  Memory and ability to understand (cognition).  Work and work Statistician.  Reproductive health. Screening  You may have the following tests or measurements:  Height, weight, and BMI.  Blood pressure.  Lipid and cholesterol levels. These may be checked every 5 years, or more frequently if you are over 19 years old.  Skin check.  Lung cancer screening. You may have this screening every year starting at age 64 if you have a 30-pack-year history of smoking and currently smoke or have quit within the past 15 years.  Fecal occult blood test (FOBT) of the stool. You may have this test every year starting at age 41.  Flexible sigmoidoscopy or colonoscopy. You may have a sigmoidoscopy every 5 years or a colonoscopy every 10 years starting at age 55.  Hepatitis C blood test.  Hepatitis B blood test.  Sexually transmitted disease (STD) testing.  Diabetes screening. This is done by checking your blood sugar (glucose) after you have not eaten for a while (fasting). You may have this done every 1-3 years.  Bone density scan. This is done to screen for osteoporosis. You  may have this done starting at age 24.  Mammogram. This may be done every 1-2 years. Talk to your health care provider about how often you should have regular mammograms. Talk with your health care provider about your test results,  treatment options, and if necessary, the need for more tests. Vaccines  Your health care provider may recommend certain vaccines, such as:  Influenza vaccine. This is recommended every year.  Tetanus, diphtheria, and acellular pertussis (Tdap, Td) vaccine. You may need a Td booster every 10 years.  Zoster vaccine. You may need this after age 52.  Pneumococcal 13-valent conjugate (PCV13) vaccine. One dose is recommended after age 43.  Pneumococcal polysaccharide (PPSV23) vaccine. One dose is recommended after age 5. Talk to your health care provider about which screenings and vaccines you need and how often you need them. This information is not intended to replace advice given to you by your health care provider. Make sure you discuss any questions you have with your health care provider. Document Released: 01/21/2015 Document Revised: 09/14/2015 Document Reviewed: 10/26/2014 Elsevier Interactive Patient Education  2017 [place] Prevention in the Home Falls can cause injuries. They can happen to people of all ages. There are many things you can do to make your home safe and to help prevent falls. What can I do on the outside of my home?  Regularly fix the edges of walkways and driveways and fix any cracks.  Remove anything that might make you trip as you walk through a door, such as a raised step or threshold.  Trim any bushes or trees on the path to your home.  Use bright outdoor lighting.  Clear any walking paths of anything that might make someone trip, such as rocks or tools.  Regularly check to see if handrails are loose or broken. Make sure that both sides of any steps have handrails.  Any raised decks and porches should have guardrails on the edges.  Have any leaves, snow, or ice cleared regularly.  Use sand or salt on walking paths during winter.  Clean up any spills in your garage right away. This includes oil or grease spills. What can I do in the  bathroom?  Use night lights.  Install grab bars by the toilet and in the tub and shower. Do not use towel bars as grab bars.  Use non-skid mats or decals in the tub or shower.  If you need to sit down in the shower, use a plastic, non-slip stool.  Keep the floor dry. Clean up any water that spills on the floor as soon as it happens.  Remove soap buildup in the tub or shower regularly.  Attach bath mats securely with double-sided non-slip rug tape.  Do not have throw rugs and other things on the floor that can make you trip. What can I do in the bedroom?  Use night lights.  Make sure that you have a light by your bed that is easy to reach.  Do not use any sheets or blankets that are too big for your bed. They should not hang down onto the floor.  Have a firm chair that has side arms. You can use this for support while you get dressed.  Do not have throw rugs and other things on the floor that can make you trip. What can I do in the kitchen?  Clean up any spills right away.  Avoid walking on wet floors.  Keep items that you use a lot  in easy-to-reach places.  If you need to reach something above you, use a strong step stool that has a grab bar.  Keep electrical cords out of the way.  Do not use floor polish or wax that makes floors slippery. If you must use wax, use non-skid floor wax.  Do not have throw rugs and other things on the floor that can make you trip. What can I do with my stairs?  Do not leave any items on the stairs.  Make sure that there are handrails on both sides of the stairs and use them. Fix handrails that are broken or loose. Make sure that handrails are as long as the stairways.  Check any carpeting to make sure that it is firmly attached to the stairs. Fix any carpet that is loose or worn.  Avoid having throw rugs at the top or bottom of the stairs. If you do have throw rugs, attach them to the floor with carpet tape.  Make sure that you have a  light switch at the top of the stairs and the bottom of the stairs. If you do not have them, ask someone to add them for you. What else can I do to help prevent falls?  Wear shoes that:  Do not have high heels.  Have rubber bottoms.  Are comfortable and fit you well.  Are closed at the toe. Do not wear sandals.  If you use a stepladder:  Make sure that it is fully opened. Do not climb a closed stepladder.  Make sure that both sides of the stepladder are locked into place.  Ask someone to hold it for you, if possible.  Clearly mark and make sure that you can see:  Any grab bars or handrails.  First and last steps.  Where the edge of each step is.  Use tools that help you move around (mobility aids) if they are needed. These include:  Canes.  Walkers.  Scooters.  Crutches.  Turn on the lights when you go into a dark area. Replace any light bulbs as soon as they burn out.  Set up your furniture so you have a clear path. Avoid moving your furniture around.  If any of your floors are uneven, fix them.  If there are any pets around you, be aware of where they are.  Review your medicines with your doctor. Some medicines can make you feel dizzy. This can increase your chance of falling. Ask your doctor what other things that you can do to help prevent falls. This information is not intended to replace advice given to you by your health care provider. Make sure you discuss any questions you have with your health care provider. Document Released: 10/21/2008 Document Revised: 06/02/2015 Document Reviewed: 01/29/2014 Elsevier Interactive Patient Education  2017 [employer].

## 2020-04-25 ENCOUNTER — Ambulatory Visit: Payer: Medicare Other | Admitting: Family Medicine

## 2020-05-13 ENCOUNTER — Ambulatory Visit (INDEPENDENT_AMBULATORY_CARE_PROVIDER_SITE_OTHER): Payer: Medicare Other | Admitting: Family Medicine

## 2020-05-13 ENCOUNTER — Encounter: Payer: Self-pay | Admitting: Family Medicine

## 2020-05-13 ENCOUNTER — Other Ambulatory Visit: Payer: Self-pay

## 2020-05-13 VITALS — BP 118/66 | HR 65 | Temp 97.7°F | Ht 62.0 in | Wt 142.0 lb

## 2020-05-13 DIAGNOSIS — R079 Chest pain, unspecified: Secondary | ICD-10-CM

## 2020-05-13 DIAGNOSIS — G2581 Restless legs syndrome: Secondary | ICD-10-CM | POA: Diagnosis not present

## 2020-05-13 DIAGNOSIS — E7800 Pure hypercholesterolemia, unspecified: Secondary | ICD-10-CM | POA: Diagnosis not present

## 2020-05-13 DIAGNOSIS — Z720 Tobacco use: Secondary | ICD-10-CM

## 2020-05-13 NOTE — Progress Notes (Signed)
Established patient visit   Patient: Katie Carter   DOB: [birth year]   75 y.o. Female  MRN: [id number] Visit Date: 05/13/2020  Today's healthcare provider: [name], PA-C   No chief complaint on file.  Subjective    HPI  Hypothyroid, follow-up  Lab Results  Component Value Date   TSH 3.180 10/15/2019   TSH 3.830 03/23/2019   T4TOTAL 7.5 03/23/2019   Wt Readings from Last 3 Encounters:  03/22/20 139 lb 6.4 oz (63.2 kg)  10/15/19 135 lb (61.2 kg)  06/23/19 138 lb (62.6 kg)    She was last seen for hypothyroid 7 months ago.  Management since that visit includes labs checked showing-no changes. She reports good compliance with treatment. She is not having side effects.   ----------------------------------------------------------------------------------------- Follow up for Tobacco use  The patient was last seen for this 7 months ago. Changes made at last visit include; Still smoking 1 ppd. No dyspnea or wheezing. Encouraged to work on smoking cessation.  She reports poor compliance with treatment. She feels that condition is Unchanged. She is not having side effects.   ----------------------------------------------------------------------------------------- Follow up for Restless leg syndrome  The patient was last seen for this 7 months ago. Changes made at last visit include; Well controlled with use of Requip 0.25 mg hs and using an exercise pedal apparatus. Sleep well and much less restlessness in legs.  She reports fair compliance with treatment. She feels that condition is Unchanged. She is not having side effects.   -----------------------------------------------------------------------------------------  Past Medical History:  Diagnosis Date   Arthritis    Chronic airway obstruction ([place])    Dyspnea    Hypercholesteremia    Lumbago 05/06/2006   Thyroid disease 10/15/2005   Tobacco use disorder    Past Surgical History:  Procedure  Laterality Date   ABDOMINAL HYSTERECTOMY  1960   abdominal Hysterectomy with bilateral salpingo-oophorectomy   APPENDECTOMY     CATARACT EXTRACTION Right 10/2012   Surgical Fixation  1981   of the left knee fracture   TOTAL KNEE ARTHROPLASTY Left 11/20/2017   Procedure: TOTAL KNEE ARTHROPLASTY hardware removal;  Surgeon: Lovell Sheehan, MD;  Location: [facility] ORS;  Service: Orthopedics;  Laterality: Left;   Social History   Tobacco Use   Smoking status: Current Every Day Smoker    Packs/day: 1.00    Years: 50.00    Pack years: 50.00    Types: Cigarettes   Smokeless tobacco: Never Used   Tobacco comment: "Has been thinking about quitting" 03/2020  Vaping Use   Vaping Use: Never used  Substance Use Topics   Alcohol use: Yes    Comment: Occasionally a couple beers/monthly   Drug use: No   Family History  Problem Relation Age of Onset   Alzheimer's disease Mother    Cancer Brother    Seizures Daughter    Allergies  Allergen Reactions   Aspirin     vomiting   Meloxicam Other (See Comments)    Severe chest pain   Tramadol Rash   Medications: Outpatient Medications Prior to Visit  Medication Sig   albuterol (PROVENTIL HFA;VENTOLIN HFA) 108 (90 BASE) MCG/ACT inhaler Inhale 1 puff into the lungs every 6 (six) hours as needed for wheezing or shortness of breath.    DM-Doxylamine-Acetaminophen (NYQUIL COLD & FLU PO) Take by mouth as needed (@@ night). (Patient not taking: No sig reported)   DM-Phenylephrine-Acetaminophen (ALKA-SELTZER PLUS DAY COLD/FLU PO) Take by mouth as needed. (Patient not  taking: No sig reported)   guaifenesin (ROBITUSSIN) 100 MG/5ML syrup Take 200 mg by mouth 3 (three) times daily as needed for cough.   rOPINIRole (REQUIP) 0.25 MG tablet TAKE 1 TABLET(0.25 MG) BY MOUTH AT BEDTIME   No facility-administered medications prior to visit.    Review of Systems  Constitutional: Negative for appetite change, chills, fatigue and fever.  Respiratory: Negative for  chest tightness and shortness of breath.   Cardiovascular: Negative for chest pain and palpitations.  Gastrointestinal: Negative for abdominal pain, nausea and vomiting.  Neurological: Negative for dizziness and weakness.       Objective    BP 118/66   Pulse 65   Temp 97.7 F (36.5 C) (Oral)   Ht 5\' 2"  (1.575 m)   Wt 142 lb (64.4 kg)   SpO2 100%   BMI 25.97 kg/m     Physical Exam Constitutional:      General: She is not in acute distress.    Appearance: She is well-developed.  HENT:     Head: Normocephalic and atraumatic.     Right Ear: Hearing and tympanic membrane normal.     Left Ear: Hearing and tympanic membrane normal.     Nose: Nose normal.  Eyes:     General: Lids are normal. No scleral icterus.       Right eye: No discharge.        Left eye: No discharge.     Conjunctiva/sclera: Conjunctivae normal.  Cardiovascular:     Rate and Rhythm: Regular rhythm.     Pulses: Normal pulses.     Heart sounds: Normal heart sounds.  Pulmonary:     Effort: Pulmonary effort is normal. No respiratory distress.     Comments: Distant breath sounds.  Abdominal:     General: Bowel sounds are normal.     Palpations: Abdomen is soft.  Musculoskeletal:        General: Normal range of motion.     Cervical back: Neck supple.     Comments: Crepitus in knees.  Skin:    Findings: No lesion or rash.  Neurological:     Mental Status: She is alert and oriented to person, place, and time.  Psychiatric:        Speech: Speech normal.        Behavior: Behavior normal.        Thought Content: Thought content normal.      No results found for any visits on 05/13/20.  Assessment & Plan     1. Chest pain, unspecified type Has noted some intermittent chest discomfort over the past few days. No dyspnea, palpitations, fever or congestion. EKG NSR today without acute changes. No association with activities. Will get follow up labs. Advised to go to ER if chest pains return and persist.  Encouraged to stop all smoking. - EKG 12-Lead - CBC with Differential/Platelet - Comprehensive metabolic panel - TSH  2. Hypercholesteremia No longer on a statin. Total cholesterol 171, triglycerides 80, HDL 49 and LDL 107 on 06-23-19. Will reassess progress with fasting labs. - Comprehensive metabolic panel - Lipid panel - TSH  3. Tobacco use Still smoking 1ppd. Counseled about need to stop all smoking and possible treatment options.  4. Restless leg syndrome Fair control with use of Requip and pedal exercise apparatus. Sleeping well. Recheck labs. - CBC with Differential/Platelet - Comprehensive metabolic panel - TSH   No follow-ups on file.      I, Reco Shonk, PA-C, have reviewed all  documentation for this visit. The documentation on 05/13/20 for the exam, diagnosis, procedures, and orders are all accurate and complete.    [name], PA-C  [facility] [phone] (phone) [phone] (fax)  [place]

## 2020-05-16 DIAGNOSIS — R079 Chest pain, unspecified: Secondary | ICD-10-CM | POA: Diagnosis not present

## 2020-05-16 DIAGNOSIS — E7800 Pure hypercholesterolemia, unspecified: Secondary | ICD-10-CM | POA: Diagnosis not present

## 2020-05-16 DIAGNOSIS — G2581 Restless legs syndrome: Secondary | ICD-10-CM | POA: Diagnosis not present

## 2020-05-17 ENCOUNTER — Other Ambulatory Visit: Payer: Self-pay

## 2020-05-17 LAB — COMPREHENSIVE METABOLIC PANEL
ALT: 9 IU/L (ref 0–32)
AST: 14 IU/L (ref 0–40)
Albumin/Globulin Ratio: 1.6 (ref 1.2–2.2)
Albumin: 4.2 g/dL (ref 3.7–4.7)
Alkaline Phosphatase: 77 IU/L (ref 44–121)
BUN/Creatinine Ratio: 15 (ref 12–28)
BUN: 15 mg/dL (ref 8–27)
Bilirubin Total: 0.3 mg/dL (ref 0.0–1.2)
CO2: 22 mmol/L (ref 20–29)
Calcium: 9.4 mg/dL (ref 8.7–10.3)
Chloride: 100 mmol/L (ref 96–106)
Creatinine, Ser: 1.01 mg/dL — ABNORMAL HIGH (ref 0.57–1.00)
Globulin, Total: 2.7 g/dL (ref 1.5–4.5)
Glucose: 87 mg/dL (ref 65–99)
Potassium: 4.4 mmol/L (ref 3.5–5.2)
Sodium: 135 mmol/L (ref 134–144)
Total Protein: 6.9 g/dL (ref 6.0–8.5)
eGFR: 57 mL/min/{1.73_m2} — ABNORMAL LOW (ref >59)

## 2020-05-17 LAB — LIPID PANEL
Chol/HDL Ratio: 4.4 ratio (ref 0.0–4.4)
Cholesterol, Total: 223 mg/dL — ABNORMAL HIGH (ref 100–199)
HDL: 51 mg/dL (ref >39)
LDL Chol Calc (NIH): 155 mg/dL — ABNORMAL HIGH (ref 0–99)
Triglycerides: 97 mg/dL (ref 0–149)
VLDL Cholesterol Cal: 17 mg/dL (ref 5–40)

## 2020-05-17 LAB — CBC WITH DIFFERENTIAL/PLATELET
Basophils Absolute: 0.1 10*3/uL (ref 0.0–0.2)
Basos: 1 %
EOS (ABSOLUTE): 0.2 10*3/uL (ref 0.0–0.4)
Eos: 1 %
Hematocrit: 45.6 % (ref 34.0–46.6)
Hemoglobin: 15.5 g/dL (ref 11.1–15.9)
Immature Grans (Abs): 0 10*3/uL (ref 0.0–0.1)
Immature Granulocytes: 0 %
Lymphocytes Absolute: 4.5 10*3/uL — ABNORMAL HIGH (ref 0.7–3.1)
Lymphs: 39 %
MCH: 28.8 pg (ref 26.6–33.0)
MCHC: 34 g/dL (ref 31.5–35.7)
MCV: 85 fL (ref 79–97)
Monocytes Absolute: 0.9 10*3/uL (ref 0.1–0.9)
Monocytes: 7 %
Neutrophils Absolute: 5.9 10*3/uL (ref 1.4–7.0)
Neutrophils: 52 %
Platelets: 251 10*3/uL (ref 150–450)
RBC: 5.38 x10E6/uL — ABNORMAL HIGH (ref 3.77–5.28)
RDW: 13 % (ref 11.7–15.4)
WBC: 11.6 10*3/uL — ABNORMAL HIGH (ref 3.4–10.8)

## 2020-05-17 LAB — TSH: TSH: 4.68 u[IU]/mL — ABNORMAL HIGH (ref 0.450–4.500)

## 2020-05-17 MED ORDER — SIMVASTATIN 20 MG PO TABS: 20.0000 mg | ORAL_TABLET | Freq: Every day | ORAL | 3 refills | Status: DC

## 2020-06-21 ENCOUNTER — Other Ambulatory Visit: Payer: Self-pay | Admitting: Family Medicine

## 2020-06-21 MED ORDER — ROPINIROLE HCL 0.25 MG PO TABS: ORAL_TABLET | ORAL | 3 refills | Status: DC

## 2020-06-21 NOTE — Telephone Encounter (Signed)
Medication Refill - Medication: Ropinirole 25 mg  Has the patient contacted their pharmacy? No.  New refill (Agent: If no, request that the patient contact the pharmacy for the refill.) (Agent: If yes, when and what did the pharmacy advise?)  Preferred Pharmacy (with phone number or street name): [facility]  [address] in [place]: Please be advised that RX refills may take up to 3 business days. We ask that you follow-up with your pharmacy.

## 2020-06-21 NOTE — Telephone Encounter (Signed)
Requested medication (s) are due for refill today: Yes  Requested medication (s) are on the active medication list: Yes  Last refill:  05/19/19  Future visit scheduled: No  Notes to clinic:  Prescription expired.    Requested Prescriptions  Pending Prescriptions Disp Refills   rOPINIRole (REQUIP) 0.25 MG tablet 90 tablet 3    Sig: TAKE 1 TABLET(0.25 MG) BY MOUTH AT BEDTIME      Neurology:  Parkinsonian Agents Passed - 06/21/2020  9:24 AM      Passed - Last BP in normal range    BP Readings from Last 1 Encounters:  05/13/20 118/66          Passed - Valid encounter within last 12 months    Recent Outpatient Visits           1 month ago Chest pain, unspecified type   [facility], Vickki Muff, PA-C   8 months ago Adult hypothyroidism   [facility], Vickki Muff, PA-C   12 months ago [place], Vickki Muff, PA-C   1 year ago Adult hypothyroidism   [facility], Vickki Muff, PA-C   1 year ago [place], [place]

## 2020-10-28 ENCOUNTER — Telehealth: Payer: Self-pay

## 2020-10-28 NOTE — Telephone Encounter (Signed)
Copied from [place] [id number]. Topic: Appointment Scheduling - Scheduling Inquiry for Clinic >> Oct 28, 2020  3:14 PM Yvette Rack wrote: Reason for CRM: Pt would like to establish care with one of the new providers. Pt also requests to have flu shot during establishment visit. Pt requests call back. Cb# [phone]

## 2020-12-08 ENCOUNTER — Ambulatory Visit: Payer: Self-pay | Admitting: *Deleted

## 2020-12-08 NOTE — Telephone Encounter (Signed)
Attempted to speak to pt, her partner answered, stated she was not available. Advised to CB to discuss symptoms, stated she would CB tomorrow. # [phone]

## 2020-12-09 NOTE — Telephone Encounter (Signed)
Called patient to review symptoms of cough and causing vomiting from cough. Patient's partner, [name], on [place], answered and was with patient . C/o patient symptoms runny nose, cough and sometimes cough causes vomiting after eating. Runny nose x 1 week , cough since Tuesday 12/06/20. Has not been tested for covid. Reports patient is feeling better today .denies chest pain , difficulty breathing , no fever. No appt requested by patient . Home care advise given. Drink warm liquids and keep hard candy or cough drops to decrease coughing episodes. Patient's partner verbalized understanding of care advise and to call back if symptoms worsen.        Reason for Disposition  ALSO, mild vomiting occurs only when coughing  Answer Assessment - Initial Assessment Questions 1. ONSET: "When did the cough begin?"      Tuesday 12/06/20 2. SEVERITY: "How bad is the cough today?"      Better today but has made her vomit after eating  3. SPUTUM: "Describe the color of your sputum" (none, dry cough; clear, white, yellow, green)     Not much denies color 4. HEMOPTYSIS: "Are you coughing up any blood?" If so ask: "How much?" (flecks, streaks, tablespoons, etc.)     na 5. DIFFICULTY BREATHING: "Are you having difficulty breathing?" If Yes, ask: "How bad is it?" (e.g., mild, moderate, severe)    - MILD: No SOB at rest, mild SOB with walking, speaks normally in sentences, can lie down, no retractions, pulse < 100.    - MODERATE: SOB at rest, SOB with minimal exertion and prefers to sit, cannot lie down flat, speaks in phrases, mild retractions, audible wheezing, pulse 100-120.    - SEVERE: Very SOB at rest, speaks in single words, struggling to breathe, sitting hunched forward, retractions, pulse > 120      None  6. FEVER: "Do you have a fever?" If Yes, ask: "What is your temperature, how was it measured, and when did it start?"     no 7. CARDIAC HISTORY: "Do you have any history of heart disease?" (e.g., heart  attack, congestive heart failure)      no 8. LUNG HISTORY: "Do you have any history of lung disease?"  (e.g., pulmonary embolus, asthma, emphysema)     no 9. PE RISK FACTORS: "Do you have a history of blood clots?" (or: recent major surgery, recent prolonged travel, bedridden)     na 10. OTHER SYMPTOMS: "Do you have any other symptoms?" (e.g., runny nose, wheezing, chest pain)       Runny nose  11. PREGNANCY: "Is there any chance you are pregnant?" "When was your last menstrual period?"       na 12. TRAVEL: "Have you traveled out of the country in the last month?" (e.g., travel history, exposures)       na  Protocols used: Cough - Acute Productive-A-AH

## 2021-02-20 ENCOUNTER — Telehealth: Payer: Self-pay

## 2021-02-20 NOTE — Telephone Encounter (Signed)
All providers schedules are booked for afternoon patient has a scheduled appt tomorrow with Ria Comment. KW

## 2021-02-20 NOTE — Telephone Encounter (Signed)
Copied from [place] [id number]. Topic: Appointment Scheduling - Scheduling Inquiry for Clinic >> Feb 20, 2021 12:41 PM McGill, Nelva Bush wrote: Reason for CRM: Pt requesting to be seen today if possible she is experiencing a lot pain on one of her toes on left leg.    I schedule pt for tomorrow 02/21/2021 11:00AM.

## 2021-02-21 ENCOUNTER — Ambulatory Visit: Payer: Medicare Other | Admitting: Physician Assistant

## 2021-02-21 NOTE — Progress Notes (Deleted)
°  ° ° °  Established patient visit   Patient: Katie Carter   DOB: 10-Aug-1945   76 y.o. Female  MRN: [id number] Visit Date: 02/21/2021  Today's healthcare provider: Dani Gobble Mecum, PA-C   No chief complaint on file.  Subjective    Toe Pain    ***  Medications: Outpatient Medications Prior to Visit  Medication Sig   guaifenesin (ROBITUSSIN) 100 MG/5ML syrup Take 200 mg by mouth 3 (three) times daily as needed for cough.   rOPINIRole (REQUIP) 0.25 MG tablet TAKE 1 TABLET(0.25 MG) BY MOUTH AT BEDTIME   simvastatin (ZOCOR) 20 MG tablet Take 1 tablet (20 mg total) by mouth at bedtime.   No facility-administered medications prior to visit.    Review of Systems  {Labs   Heme   Chem   Endocrine   Serology   Results Review (optional):23779}   Objective    There were no vitals taken for this visit. {Show previous vital signs (optional):23777}  Physical Exam  ***  No results found for any visits on 02/21/21.  Assessment & Plan     ***  No follow-ups on file.      {provider attestation***:1}   Larey Seat  [facility] [phone] (phone) [phone] (fax)  [place]

## 2021-02-22 ENCOUNTER — Ambulatory Visit (INDEPENDENT_AMBULATORY_CARE_PROVIDER_SITE_OTHER): Payer: Medicare Other | Admitting: Family Medicine

## 2021-02-22 ENCOUNTER — Encounter: Payer: Self-pay | Admitting: Family Medicine

## 2021-02-22 ENCOUNTER — Other Ambulatory Visit: Payer: Self-pay

## 2021-02-22 VITALS — BP 114/64 | HR 72 | Temp 98.3°F | Resp 16 | Wt 142.0 lb

## 2021-02-22 DIAGNOSIS — L608 Other nail disorders: Secondary | ICD-10-CM | POA: Diagnosis not present

## 2021-02-22 DIAGNOSIS — L602 Onychogryphosis: Secondary | ICD-10-CM | POA: Insufficient documentation

## 2021-02-22 DIAGNOSIS — Z23 Encounter for immunization: Secondary | ICD-10-CM

## 2021-02-22 NOTE — Progress Notes (Signed)
° °  SUBJECTIVE:   CHIEF COMPLAINT / HPI:   Toenail concerns - has noticed longer nails, unsure how long has been dealing with this - denies issues with cutting nails but hasn't tried cutting nails recently - causing pain when wearing shoes - denies discharge, skin changes, trauma   OBJECTIVE:   BP 114/64 (BP Location: Right Arm, Patient Position: Sitting, Cuff Size: Large)    Pulse 72    Temp 98.3 F (36.8 C) (Oral)    Resp 16    Wt 142 lb (64.4 kg)    BMI 25.97 kg/m   Gen: well appearing, in NAD Feet: bilateral thickened, hypertrophic nails with elongation of b/l first and L third nails. No discharge, redness, swelling. Intact DP, PT pulses. No evidence of fungal infection.   ASSESSMENT/PLAN:   Onychogryphosis No current evidence of infection. Refer to podiatry for nail trimming. Instructed on proper foot wear including wide toe box.      Myles Gip, DO

## 2021-02-22 NOTE — Assessment & Plan Note (Signed)
No current evidence of infection. Refer to podiatry for nail trimming. Instructed on proper foot wear including wide toe box.

## 2021-03-02 ENCOUNTER — Ambulatory Visit (INDEPENDENT_AMBULATORY_CARE_PROVIDER_SITE_OTHER): Payer: Medicare Other | Admitting: Podiatry

## 2021-03-02 ENCOUNTER — Encounter: Payer: Self-pay | Admitting: Podiatry

## 2021-03-02 ENCOUNTER — Other Ambulatory Visit: Payer: Self-pay

## 2021-03-02 VITALS — BP 132/77 | HR 67 | Temp 97.5°F | Resp 20

## 2021-03-02 DIAGNOSIS — B351 Tinea unguium: Secondary | ICD-10-CM

## 2021-03-02 DIAGNOSIS — M79674 Pain in right toe(s): Secondary | ICD-10-CM | POA: Diagnosis not present

## 2021-03-02 DIAGNOSIS — M79675 Pain in left toe(s): Secondary | ICD-10-CM | POA: Diagnosis not present

## 2021-03-02 NOTE — Progress Notes (Signed)
°  Subjective:  Patient ID: Katie Carter, female    DOB: [birth year],  MRN: [id number]  Chief Complaint  Patient presents with   Nail Problem    "She has very thick toenails.  The second one on the left foot is bothersome."   76 y.o. female returns for the above complaint.  Patient presents with thickened elongated dystrophic toenails x10.  Pain on palpation.  Patient would like to have them debrided as she is not able to do her self.  She is not diabetic.  Objective:   Vitals:   03/02/21 1510  BP: 132/77  Pulse: 67  Resp: 20  Temp: (!) 97.5 F (36.4 C)   Podiatric Exam: Vascular: dorsalis pedis and posterior tibial pulses are palpable bilateral. Capillary return is immediate. Temperature gradient is WNL. Skin turgor WNL  Sensorium: Normal Semmes Weinstein monofilament test. Normal tactile sensation bilaterally. Nail Exam: Pt has thick disfigured discolored nails with subungual debris noted bilateral entire nail hallux through fifth toenails.  Pain on palpation to the nails. Ulcer Exam: There is no evidence of ulcer or pre-ulcerative changes or infection. Orthopedic Exam: Muscle tone and strength are WNL. No limitations in general ROM. No crepitus or effusions noted.  Skin: No Porokeratosis. No infection or ulcers    Assessment & Plan:   1. Pain due to onychomycosis of toenails of both feet     Patient was evaluated and treated and all questions answered.  Onychomycosis with pain  -Nails palliatively debrided as below. -Educated on self-care  Procedure: Nail Debridement Rationale: pain  Type of Debridement: manual, sharp debridement. Instrumentation: Nail nipper, rotary burr. Number of Nails: 10  Procedures and Treatment: Consent by patient was obtained for treatment procedures. The patient understood the discussion of treatment and procedures well. All questions were answered thoroughly reviewed. Debridement of mycotic and hypertrophic toenails, 1 through 5 bilateral and  clearing of subungual debris. No ulceration, no infection noted.  Return Visit-Office Procedure: Patient instructed to return to the office for a follow up visit 3 months for continued evaluation and treatment.  Boneta Lucks, DPM    No follow-ups on file.

## 2021-03-23 NOTE — Progress Notes (Signed)
? ? ?I,Katie Carter,acting as a Education administrator for [employer], PA-C.,have documented all relevant documentation on the behalf of Katie Kirschner, PA-C,as directed by  Katie Kirschner, PA-C while in the presence of Katie Kirschner, PA-C. ? ? ?Annual Wellness Visit ? ? ? ?Patient: Katie Carter, Female    DOB: 07/26/1945, 76 y.o.   MRN: [id number] ?Visit Date: 03/24/2021 ? ?Today's Provider: [name], PA-C  ? ?Cc. Awv, concerns over dementia ? ?Subjective  ?  ?Katie Carter is a 76 y.o. female who presents today for her Annual Wellness Visit. ?She reports consuming a general and low fat diet. The patient does not participate in regular exercise at present. She generally feels well. She reports sleeping well. She does have additional problems to discuss today.  ?Katie Carter is accompanied today by her sister in law Katie Carter. Katie Carter reports some concerns over changes in memory. Pt initially is not concerned, but does reflect on some memory changes. Still drives. Denies ever getting lost while driving.  ? ?Katie Carter reports a persistent mass on her bottom, denies pain, changes in size, rashes. ? ?She has stopped her cholesterol medication as it caused stomach upset.  ?She still smokes daily, buy cigarettes by the carton. Denies cough, SOB, wheezing, but Katie Carter attests to chronic productive cough, SOB and wheezing--nothing has changed or is new onset.  ? ?Reports leg spasms at night, was initially managed by ropinirole but pt reports now taking it in the morning and it doesn't help. She sometimes exercises w/ a stationary bike and this improves her symptoms.  ? ?Medications: ?Outpatient Medications Prior to Visit  ?Medication Sig  ? rOPINIRole (REQUIP) 0.25 MG tablet TAKE 1 TABLET(0.25 MG) BY MOUTH AT BEDTIME  ? simvastatin (ZOCOR) 20 MG tablet Take 1 tablet (20 mg total) by mouth at bedtime. (Patient not taking: Reported on 03/02/2021)  ? ?No facility-administered medications prior to visit.  ?  ?Allergies  ?Allergen Reactions  ? Aspirin    ?  vomiting  ? Meloxicam Other (See Comments)  ?  Severe chest pain  ? Tramadol Rash  ? ? ?Patient Care Team: ?Katie Carter as PCP - General (Physician Assistant) ? ?Review of Systems  ?Constitutional: Negative.  Negative for fatigue and fever.  ?Eyes: Negative.   ?Respiratory:  Negative for cough and shortness of breath.   ?Cardiovascular: Negative.  Negative for chest pain and leg swelling.  ?Gastrointestinal: Negative.  Negative for abdominal pain.  ?Endocrine: Negative.   ?Genitourinary: Negative.   ?Musculoskeletal: Negative.   ?Skin: Negative.   ?Allergic/Immunologic: Negative.   ?Neurological: Negative.  Negative for dizziness and headaches.  ?Hematological: Negative.   ?Psychiatric/Behavioral:  Positive for confusion.   ? ?  ? Objective  ?  ?Blood pressure 121/69, pulse 75, height '5\' 2"'$  (1.575 m), weight 139 lb 14.4 oz (63.5 kg), SpO2 97 %.  ? ? ?Physical Exam ?Constitutional:   ?   General: She is awake.  ?   Appearance: She is well-developed.  ?HENT:  ?   Head: Normocephalic.  ?   Right Ear: Tympanic membrane normal.  ?   Left Ear: Tympanic membrane normal.  ?Eyes:  ?   Conjunctiva/sclera: Conjunctivae normal.  ?   Pupils: Pupils are equal, round, and reactive to light.  ?Neck:  ?   Thyroid: No thyroid mass or thyromegaly.  ?Cardiovascular:  ?   Rate and Rhythm: Normal rate and regular rhythm.  ?   Heart sounds: Normal heart sounds.  ?Pulmonary:  ?   Effort: Pulmonary  effort is normal.  ?   Breath sounds: Normal breath sounds.  ?Abdominal:  ?   Palpations: Abdomen is soft.  ?   Tenderness: There is no abdominal tenderness.  ?Musculoskeletal:  ?   Right lower leg: No swelling. No edema.  ?   Left lower leg: No swelling. No edema.  ?Lymphadenopathy:  ?   Cervical: No cervical adenopathy.  ?Skin: ?   General: Skin is warm.  ?   Comments: Midline lower spine/upper buttock there is a 5-6 cm well circumscribed, soft mass. ?Immobile. ?No overlying skin changes  ?Neurological:  ?   Mental Status: She is  alert and oriented to person, place, and time.  ?Psychiatric:     ?   Attention and Perception: Attention normal.     ?   Mood and Affect: Mood normal.     ?   Speech: Speech normal.     ?   Behavior: Behavior is cooperative.  ? ? ?Most recent functional status assessment: ?In your present state of health, do you have any difficulty performing the following activities: 05/13/2020  ?Hearing? N  ?Vision? N  ?Difficulty concentrating or making decisions? N  ?Walking or climbing stairs? N  ?Dressing or bathing? N  ?Doing errands, shopping? N  ?Some recent data might be hidden  ? ?Most recent fall risk assessment: ?Fall Risk  05/13/2020  ?Falls in the past year? 0  ?Number falls in past yr: -  ?Injury with Fall? -  ?Follow up Falls evaluation completed  ? ? Most recent depression screenings: ?PHQ 2/9 Scores 03/24/2021 05/13/2020  ?PHQ - 2 Score - 0  ?PHQ- 9 Score - 0  ?Exception Documentation (No Data) -  ? ?Most recent cognitive screening: ?No flowsheet data found. ?Most recent Audit-C alcohol use screening ?Alcohol Use Disorder Test (AUDIT) 05/13/2020  ?1. How often do you have a drink containing alcohol? 1  ?2. How many drinks containing alcohol do you have on a typical day when you are drinking? 0  ?3. How often do you have six or more drinks on one occasion? 0  ?AUDIT-C Score 1  ?Alcohol Brief Interventions/Follow-up -  ? ?A score of 3 or more in women, and 4 or more in men indicates increased risk for alcohol abuse, EXCEPT if all of the points are from question 1  ? ?MMSE - Mini Mental State Exam 03/24/2021  ?Orientation to time 1  ?Orientation to Place 3  ?Registration 3  ?Attention/ Calculation 4  ?Recall 0  ?Language- name 2 objects 2  ?Language- repeat 1  ?Language- follow 3 step command 1  ?Language- read & follow direction 1  ?Write a sentence 1  ?Copy design 0  ?Total score 17  ? ? ?No results found for any visits on 03/24/21. ? Assessment & Plan  ?  ? ?Annual wellness visit done today including the all of the  following: ?Reviewed patient's Family Medical History ?Reviewed and updated list of patient's medical providers ?Assessment of cognitive impairment was done ?Assessed patient's functional ability ?Established a written schedule for health screening services ?Health Risk Assessent Completed and Reviewed ? ?Exercise Activities and Dietary recommendations ? Goals   ? ?  DIET - INCREASE WATER INTAKE   ?  Recommend to drink at least 6-8 8oz glasses of water per day. ?  ?  Exercise 3x per week (30 min per time)   ?  Recommend to start exercising 3 days a week for at least minutes a time. Pt to  start exercising when the weather warms up.  ?  ?  Quit Smoking   ?  Recommend to continue efforts to reduce smoking habits until no longer smoking (Smoking Cessation literature attached to AVS). ? ? ?  ? ?  ? ? ?Immunization History  ?Administered Date(s) Administered  ? Fluad Quad(high Dose 65+) 10/15/2019  ? Influenza, High Dose Seasonal PF 10/02/2016, 11/05/2017  ? Influenza-Unspecified 11/08/2020  ? PFIZER(Purple Top)SARS-COV-2 Vaccination 03/29/2019, 04/19/2019, 01/20/2020  ? Pneumococcal Conjugate-13 10/02/2016  ? Pneumococcal Polysaccharide-23 11/05/2017  ? ? ?Health Maintenance  ?Topic Date Due  ? DEXA SCAN  03/01/2016  ? COVID-19 Vaccine (4 - Booster for [place] series) 04/09/2021 (Originally 03/16/2020)  ? Zoster Vaccines- Shingrix (1 of 2) 06/24/2021 (Originally 01/25/1993)  ? TETANUS/TDAP  03/25/2022 (Originally 01/25/1962)  ? Pneumonia Vaccine 77+ Years old  Completed  ? INFLUENZA VACCINE  Completed  ? Hepatitis C Screening  Completed  ? HPV VACCINES  Aged Out  ? ? ? ?Discussed health benefits of physical activity, and encouraged her to engage in regular exercise appropriate for her age and condition.  ?  ?Problem List Items Addressed This Visit   ? ?  ? Respiratory  ? CAFL (chronic airflow limitation) (HCC)  ?  Dx previously on chart ?Symptoms correspond to me as COPD d/t chronic productive cough, smoking history ?PT  prefers not to proceed w/ additional testing ie cxr/pfts  ?rx Spiriva inhaler daily. Sister in law laura made aware, will tell her partner as well who functions as her caretaker ?--will treat empirically for no

## 2021-03-24 ENCOUNTER — Encounter: Payer: Self-pay | Admitting: Physician Assistant

## 2021-03-24 ENCOUNTER — Other Ambulatory Visit: Payer: Self-pay

## 2021-03-24 ENCOUNTER — Ambulatory Visit (INDEPENDENT_AMBULATORY_CARE_PROVIDER_SITE_OTHER): Payer: Medicare Other | Admitting: Physician Assistant

## 2021-03-24 VITALS — BP 121/69 | HR 75 | Ht 62.0 in | Wt 139.9 lb

## 2021-03-24 DIAGNOSIS — J449 Chronic obstructive pulmonary disease, unspecified: Secondary | ICD-10-CM | POA: Diagnosis not present

## 2021-03-24 DIAGNOSIS — G2581 Restless legs syndrome: Secondary | ICD-10-CM | POA: Diagnosis not present

## 2021-03-24 DIAGNOSIS — E782 Mixed hyperlipidemia: Secondary | ICD-10-CM | POA: Diagnosis not present

## 2021-03-24 DIAGNOSIS — R7989 Other specified abnormal findings of blood chemistry: Secondary | ICD-10-CM

## 2021-03-24 DIAGNOSIS — Z0000 Encounter for general adult medical examination without abnormal findings: Secondary | ICD-10-CM | POA: Diagnosis not present

## 2021-03-24 DIAGNOSIS — Z720 Tobacco use: Secondary | ICD-10-CM

## 2021-03-24 DIAGNOSIS — D1779 Benign lipomatous neoplasm of other sites: Secondary | ICD-10-CM

## 2021-03-24 DIAGNOSIS — F03B0 Unspecified dementia, moderate, without behavioral disturbance, psychotic disturbance, mood disturbance, and anxiety: Secondary | ICD-10-CM | POA: Insufficient documentation

## 2021-03-24 DIAGNOSIS — E7800 Pure hypercholesterolemia, unspecified: Secondary | ICD-10-CM | POA: Diagnosis not present

## 2021-03-24 DIAGNOSIS — D179 Benign lipomatous neoplasm, unspecified: Secondary | ICD-10-CM | POA: Insufficient documentation

## 2021-03-24 DIAGNOSIS — E039 Hypothyroidism, unspecified: Secondary | ICD-10-CM

## 2021-03-24 DIAGNOSIS — F03A0 Unspecified dementia, mild, without behavioral disturbance, psychotic disturbance, mood disturbance, and anxiety: Secondary | ICD-10-CM

## 2021-03-24 MED ORDER — SPIRIVA RESPIMAT 2.5 MCG/ACT IN AERS: 2.0000 | INHALATION_SPRAY | Freq: Every day | RESPIRATORY_TRACT | 3 refills | Status: DC

## 2021-03-24 NOTE — Assessment & Plan Note (Signed)
MSE 17 ?New diagnosis. Pt prefers to not refer to neuro, prefers no new medications ?Sister in law Mickel Baas is concerned, but does not attest to issues with safety.  ?Explained safety in the home, explained she is having deficits with memory and to let me know if things become frustrating, difficult. Pt expresses agreement, she is oriented x 3 today.  ?

## 2021-03-24 NOTE — Assessment & Plan Note (Signed)
discussed consequences of long-term smoking ?Pt aged out of CT lung scan  ? ?

## 2021-03-24 NOTE — Assessment & Plan Note (Addendum)
Dx previously on chart ?Symptoms correspond to me as COPD d/t chronic productive cough, smoking history ?PT prefers not to proceed w/ additional testing ie cxr/pfts  ?rx Spiriva inhaler daily. Sister in law laura made aware, will tell her partner as well who functions as her caretaker ?--will treat empirically for now ?

## 2021-03-24 NOTE — Assessment & Plan Note (Signed)
Unmedicated? Last tsh abnormal ?Will recheck tsh/t4 ?

## 2021-03-24 NOTE — Assessment & Plan Note (Signed)
Chronic, stable per pt, midline lower spine ?No further testing is indicated ?

## 2021-03-24 NOTE — Assessment & Plan Note (Signed)
Due to issues remembering medications, she often forgets at night and takes in AM ?Advised to try to consistently take ropinirole in PM and if still does not improve symptoms, would refer to neuro w/ cocurrent dementia symptoms  ?No parkinsonian features observed during exam ?

## 2021-03-24 NOTE — Assessment & Plan Note (Signed)
Currently unmedicated as did not tolerate statin  ?Will recheck lipid profile ?The 10-year ASCVD risk score (Arnett DK, et al., 2019) is: 25.9% ? ?

## 2021-03-25 LAB — CBC
Hematocrit: 47 % — ABNORMAL HIGH (ref 34.0–46.6)
Hemoglobin: 15.9 g/dL (ref 11.1–15.9)
MCH: 28.5 pg (ref 26.6–33.0)
MCHC: 33.8 g/dL (ref 31.5–35.7)
MCV: 84 fL (ref 79–97)
Platelets: 293 10*3/uL (ref 150–450)
RBC: 5.58 x10E6/uL — ABNORMAL HIGH (ref 3.77–5.28)
RDW: 12.4 % (ref 11.7–15.4)
WBC: 11.1 10*3/uL — ABNORMAL HIGH (ref 3.4–10.8)

## 2021-03-25 LAB — COMPREHENSIVE METABOLIC PANEL
ALT: 14 IU/L (ref 0–32)
AST: 14 IU/L (ref 0–40)
Albumin/Globulin Ratio: 1.6 (ref 1.2–2.2)
Albumin: 4.5 g/dL (ref 3.7–4.7)
Alkaline Phosphatase: 92 IU/L (ref 44–121)
BUN/Creatinine Ratio: 17 (ref 12–28)
BUN: 19 mg/dL (ref 8–27)
Bilirubin Total: 0.3 mg/dL (ref 0.0–1.2)
CO2: 24 mmol/L (ref 20–29)
Calcium: 9.9 mg/dL (ref 8.7–10.3)
Chloride: 98 mmol/L (ref 96–106)
Creatinine, Ser: 1.09 mg/dL — ABNORMAL HIGH (ref 0.57–1.00)
Globulin, Total: 2.9 g/dL (ref 1.5–4.5)
Glucose: 88 mg/dL (ref 70–99)
Potassium: 4.8 mmol/L (ref 3.5–5.2)
Sodium: 136 mmol/L (ref 134–144)
Total Protein: 7.4 g/dL (ref 6.0–8.5)
eGFR: 52 mL/min/{1.73_m2} — ABNORMAL LOW (ref >59)

## 2021-03-25 LAB — LIPID PANEL
Chol/HDL Ratio: 4.4 ratio (ref 0.0–4.4)
Cholesterol, Total: 205 mg/dL — ABNORMAL HIGH (ref 100–199)
HDL: 47 mg/dL (ref >39)
LDL Chol Calc (NIH): 140 mg/dL — ABNORMAL HIGH (ref 0–99)
Triglycerides: 97 mg/dL (ref 0–149)
VLDL Cholesterol Cal: 18 mg/dL (ref 5–40)

## 2021-03-25 LAB — TSH+FREE T4
Free T4: 1.18 ng/dL (ref 0.82–1.77)
TSH: 6.19 u[IU]/mL — ABNORMAL HIGH (ref 0.450–4.500)

## 2021-03-26 ENCOUNTER — Encounter: Payer: Self-pay | Admitting: Physician Assistant

## 2021-03-27 ENCOUNTER — Other Ambulatory Visit: Payer: Self-pay | Admitting: Physician Assistant

## 2021-03-27 DIAGNOSIS — E039 Hypothyroidism, unspecified: Secondary | ICD-10-CM

## 2021-03-27 DIAGNOSIS — E7800 Pure hypercholesterolemia, unspecified: Secondary | ICD-10-CM

## 2021-03-27 MED ORDER — PRAVASTATIN SODIUM 20 MG PO TABS: 20.0000 mg | ORAL_TABLET | Freq: Every day | ORAL | 1 refills | Status: DC

## 2021-03-27 MED ORDER — LEVOTHYROXINE SODIUM 25 MCG PO TABS: 25.0000 ug | ORAL_TABLET | Freq: Every day | ORAL | 1 refills | Status: DC

## 2021-03-28 ENCOUNTER — Other Ambulatory Visit: Payer: Self-pay

## 2021-03-28 DIAGNOSIS — E039 Hypothyroidism, unspecified: Secondary | ICD-10-CM

## 2021-06-01 ENCOUNTER — Ambulatory Visit (INDEPENDENT_AMBULATORY_CARE_PROVIDER_SITE_OTHER): Payer: Medicare Other | Admitting: Podiatry

## 2021-06-01 ENCOUNTER — Encounter: Payer: Self-pay | Admitting: Podiatry

## 2021-06-01 DIAGNOSIS — B351 Tinea unguium: Secondary | ICD-10-CM

## 2021-06-01 DIAGNOSIS — M79675 Pain in left toe(s): Secondary | ICD-10-CM

## 2021-06-01 DIAGNOSIS — M79674 Pain in right toe(s): Secondary | ICD-10-CM

## 2021-06-01 DIAGNOSIS — M2041 Other hammer toe(s) (acquired), right foot: Secondary | ICD-10-CM

## 2021-06-01 NOTE — Progress Notes (Signed)
This patient presents to the office with chief complaint of long thick painful nails.  Patient says the nails are painful walking and wearing shoes.  This patient is unable to self treat.  This patient is unable to trim her nails since she is unable to reach her nails. She is also concerned about a skin lesion due to her contracted second toe right foot. She presents to the office for preventative foot care services.  General Appearance  Alert, conversant and in no acute stress.  Vascular  Dorsalis pedis and posterior tibial  pulses are palpable  bilaterally.  Capillary return is within normal limits  bilaterally. Temperature is within normal limits  bilaterally.  Neurologic  Senn-Weinstein monofilament wire test within normal limits  bilaterally. Muscle power within normal limits bilaterally.  Nails Thick disfigured discolored nails with subungual debris  from hallux to fifth toes bilaterally. No evidence of bacterial infection or drainage bilaterally.  Orthopedic  No limitations of motion  feet .  No crepitus or effusions noted.  No bony pathology or digital deformities noted.  Skin  normotropic skin with no porokeratosis noted bilaterally.  No signs of infections or ulcers noted.     Onychomycosis  Nails  B/L.  Pain in right toes  Pain in left toes  Hammer toe second right foot.  Debridement of nails both feet followed trimming the nails with dremel tool.   Discussed hammer toe with this patient.  Padding dispensed.   RTC 3 months.   Gardiner Barefoot DPM

## 2021-06-12 ENCOUNTER — Telehealth: Payer: Self-pay | Admitting: Physician Assistant

## 2021-06-12 ENCOUNTER — Other Ambulatory Visit: Payer: Self-pay

## 2021-06-12 MED ORDER — ROPINIROLE HCL 0.25 MG PO TABS: ORAL_TABLET | ORAL | 0 refills | Status: DC

## 2021-06-12 NOTE — Telephone Encounter (Signed)
[facility] faxed refill request for the following medications:  rOPINIRole (REQUIP) 0.25 MG tablet   Please advise.

## 2021-06-14 ENCOUNTER — Other Ambulatory Visit: Payer: Self-pay

## 2021-06-14 ENCOUNTER — Other Ambulatory Visit: Payer: Self-pay | Admitting: Physician Assistant

## 2021-06-14 ENCOUNTER — Telehealth: Payer: Self-pay | Admitting: Physician Assistant

## 2021-06-14 MED ORDER — ROPINIROLE HCL 0.25 MG PO TABS: ORAL_TABLET | ORAL | 0 refills | Status: DC

## 2021-06-14 NOTE — Telephone Encounter (Signed)
Disregard previous message on Ropinirole.  It was sent to wrong pharmacy.  It has been corrected and sent to [facility].

## 2021-06-14 NOTE — Telephone Encounter (Signed)
Patient has been waiting on a refill for Ropinirole since Monday.  Pharmacy contacted [place] on 06/12/21 and a message was sent by Jiles Garter to [employer] nurse box. Patient's husband just came up to my desk highly upset and wanted to know why this hadnt been approved by Ria Comment.    He said to tell you that "he didn't appreciate this at all".  Rx needs to go to [facility] on [address]

## 2021-06-14 NOTE — Telephone Encounter (Signed)
This was sent in to the pharmacy on 06/12/21.  But was apparently sent to the incorrect pharmacy.  Rx has been sent to correct pharmacy.

## 2021-06-20 ENCOUNTER — Encounter: Payer: Self-pay | Admitting: Physician Assistant

## 2021-06-26 ENCOUNTER — Ambulatory Visit: Payer: Medicare Other | Admitting: Physician Assistant

## 2021-06-27 ENCOUNTER — Encounter: Payer: Self-pay | Admitting: Physician Assistant

## 2021-06-27 ENCOUNTER — Ambulatory Visit (INDEPENDENT_AMBULATORY_CARE_PROVIDER_SITE_OTHER): Payer: Medicare Other | Admitting: Physician Assistant

## 2021-06-27 VITALS — BP 130/71 | HR 64 | Temp 97.9°F | Wt 137.2 lb

## 2021-06-27 DIAGNOSIS — E039 Hypothyroidism, unspecified: Secondary | ICD-10-CM

## 2021-06-27 DIAGNOSIS — N1831 Chronic kidney disease, stage 3a: Secondary | ICD-10-CM | POA: Diagnosis not present

## 2021-06-27 DIAGNOSIS — J449 Chronic obstructive pulmonary disease, unspecified: Secondary | ICD-10-CM

## 2021-06-27 DIAGNOSIS — F03A0 Unspecified dementia, mild, without behavioral disturbance, psychotic disturbance, mood disturbance, and anxiety: Secondary | ICD-10-CM | POA: Diagnosis not present

## 2021-06-27 DIAGNOSIS — E7800 Pure hypercholesterolemia, unspecified: Secondary | ICD-10-CM

## 2021-06-27 MED ORDER — TRELEGY ELLIPTA 100-62.5-25 MCG/ACT IN AEPB: 1.0000 | INHALATION_SPRAY | Freq: Every day | RESPIRATORY_TRACT | 3 refills | Status: DC

## 2021-06-27 NOTE — Assessment & Plan Note (Addendum)
Based on last labs had started pt on pravastatin 20 mg ; unclear if she is currently taking Will recheck lipids and advised pt to bring all med bottles to next appointment

## 2021-06-27 NOTE — Progress Notes (Signed)
I,Sha'taria Tyson,acting as a Education administrator for [employer], PA-C.,have documented all relevant documentation on the behalf of [name], PA-C,as directed by  [name], PA-C while in the presence of [name], PA-C.   Established patient visit   Patient: Katie Carter   DOB: 12/10/1945   76 y.o. Female  MRN: [id number] Visit Date: 06/27/2021  Today's healthcare provider: [name], PA-C   Cc. Chronic care f/u  Subjective    HPI   Reports improvement in her breathing, denies any episodes of SOB, chest pain, syncope. Denies change in cough. Still smoking daily.  Lipid/Cholesterol, Follow-up  Last lipid panel Other pertinent labs  Lab Results  Component Value Date   CHOL 205 (H) 03/24/2021   HDL 47 03/24/2021   LDLCALC 140 (H) 03/24/2021   TRIG 97 03/24/2021   CHOLHDL 4.4 03/24/2021   Lab Results  Component Value Date   ALT 14 03/24/2021   AST 14 03/24/2021   PLT 293 03/24/2021   TSH 6.190 (H) 03/24/2021     She was last seen for this 3 months ago.  Management since that visit includes would like her to try pravastatin which has less side effects .  She reports excellent compliance with treatment. She is not having side effects.   Symptoms: No chest pain No chest pressure/discomfort  No dyspnea No lower extremity edema  No numbness or tingling of extremity No orthopnea  No palpitations No paroxysmal nocturnal dyspnea  No speech difficulty No syncope   Current diet: well balanced Current exercise: bicycling  The 10-year ASCVD risk score (Arnett DK, et al., 2019) is: 28.9%  ---------------------------------------------------------------------------------------------------  Hypothyroid, follow-up  Lab Results  Component Value Date   TSH 6.190 (H) 03/24/2021   TSH 4.680 (H) 05/16/2020   TSH 3.180 10/15/2019   FREET4 1.18 03/24/2021   T4TOTAL 7.5 03/23/2019    Wt Readings from Last 3 Encounters:  06/27/21 137 lb 3.2 oz (62.2 kg)   03/24/21 139 lb 14.4 oz (63.5 kg)  02/22/21 142 lb (64.4 kg)    She was last seen for hypothyroid 3 months ago.  Management since that visit includes add a low dose thyroid medication. She reports poor compliance with treatment. She is not having side effects.   Symptoms: No change in energy level No constipation  No diarrhea No heat / cold intolerance  No nervousness No palpitations  No weight changes    -----------------------------------------------------------------------------------------  -   Medications: Outpatient Medications Prior to Visit  Medication Sig   pravastatin (PRAVACHOL) 20 MG tablet Take 1 tablet (20 mg total) by mouth daily.   rOPINIRole (REQUIP) 0.25 MG tablet TAKE 1 TABLET(0.25 MG) BY MOUTH AT BEDTIME   [DISCONTINUED] Tiotropium Bromide Monohydrate (SPIRIVA RESPIMAT) 2.5 MCG/ACT AERS Inhale 2 puffs into the lungs daily.   levothyroxine (SYNTHROID) 25 MCG tablet Take 1 tablet (25 mcg total) by mouth daily. Take in the morning at least 30 minutes before food (Patient not taking: Reported on 06/27/2021)   [DISCONTINUED] simvastatin (ZOCOR) 20 MG tablet Take 20 mg by mouth at bedtime. (Patient not taking: Reported on 06/27/2021)   No facility-administered medications prior to visit.    Review of Systems  Constitutional:  Negative for fatigue and fever.  Respiratory:  Negative for cough and shortness of breath.   Cardiovascular:  Negative for chest pain and leg swelling.  Gastrointestinal:  Negative for abdominal pain.  Neurological:  Negative for dizziness and headaches.     Objective  Blood pressure 130/71, pulse 64, temperature 97.9 F (36.6 C), temperature source Oral, weight 137 lb 3.2 oz (62.2 kg), SpO2 99 %.   Physical Exam Constitutional:      General: She is awake.     Appearance: She is well-developed.  HENT:     Head: Normocephalic.  Eyes:     Conjunctiva/sclera: Conjunctivae normal.  Cardiovascular:     Rate and Rhythm: Normal rate and  regular rhythm.     Heart sounds: Normal heart sounds.  Pulmonary:     Effort: Pulmonary effort is normal.     Breath sounds: Wheezing and rhonchi present.  Skin:    General: Skin is warm.  Neurological:     Mental Status: She is alert.     Comments: Oriented to person and place  Psychiatric:        Attention and Perception: Attention normal.        Mood and Affect: Mood normal.        Speech: Speech normal.        Behavior: Behavior is cooperative.    No results found for any visits on 06/27/21.  Assessment & Plan     Problem List Items Addressed This Visit       Respiratory   CAFL (chronic airflow limitation) (HCC)    Dx previously on chart; no PFTs found.  Pt is resistant to pulmonary referral, pft referral, chest xray, lung cancer screening. Previously rx Spiriva empirically, some improvement. Due to pt's declining specialist referral and additional testing, will d/c Spiriva and rx trellegy, 1 puff daily.       Relevant Medications   Fluticasone-Umeclidin-Vilant (TRELEGY ELLIPTA) 100-62.5-25 MCG/ACT AEPB     Endocrine   Adult hypothyroidism - Primary    Had started on 25 mg of levothyroxine based on last labs; really unclear if she is currently taking medication Will recheck tsh/t4. Advised she bring her med bottles to next appointment      Relevant Orders   TSH + free T4     Nervous and Auditory   Mild dementia without behavioral disturbance, psychotic disturbance, mood disturbance, or anxiety ([place])    Pt unable to recall her medications, meals, timeline of appointments. Advised family today to have her primary caregiver accompany her to next visit. We will repeat mse at that time        Other   Hypercholesteremia    Based on last labs had started pt on pravastatin 20 mg ; unclear if she is currently taking Will recheck lipids and advised pt to bring all med bottles to next appointment       Relevant Orders   Comprehensive Metabolic Panel (CMET)    Lipid Profile   Other Visit Diagnoses     Chronic kidney disease, stage 3a ([place])       Relevant Orders   CBC w/Diff/Platelet   Comprehensive Metabolic Panel (CMET)       Pt was given a detailed med list with timing of medications to bring home to caregiver.   Return in about 6 weeks (around 08/08/2021) for chronic conditions.      I, [name], PA-C have reviewed all documentation for this visit. The documentation on  06/27/2021 or the exam, diagnosis, procedures, and orders are all accurate and complete.  [name], PA-C [facility] [address] [place], [place], [address] Office: [phone] Fax: [place]

## 2021-06-27 NOTE — Assessment & Plan Note (Signed)
Pt unable to recall her medications, meals, timeline of appointments. Advised family today to have her primary caregiver accompany her to next visit. We will repeat mse at that time

## 2021-06-27 NOTE — Assessment & Plan Note (Addendum)
Had started on 25 mg of levothyroxine based on last labs; really unclear if she is currently taking medication Will recheck tsh/t4. Advised she bring her med bottles to next appointment

## 2021-06-27 NOTE — Assessment & Plan Note (Signed)
Dx previously on chart; no PFTs found.  Pt is resistant to pulmonary referral, pft referral, chest xray, lung cancer screening. Previously rx Spiriva empirically, some improvement. Due to pt's declining specialist referral and additional testing, will d/c Spiriva and rx trellegy, 1 puff daily.

## 2021-06-28 ENCOUNTER — Encounter: Payer: Self-pay | Admitting: Physician Assistant

## 2021-06-28 LAB — COMPREHENSIVE METABOLIC PANEL
ALT: 11 IU/L (ref 0–32)
AST: 8 IU/L (ref 0–40)
Albumin/Globulin Ratio: 1.4 (ref 1.2–2.2)
Albumin: 4.2 g/dL (ref 3.7–4.7)
Alkaline Phosphatase: 77 IU/L (ref 44–121)
BUN/Creatinine Ratio: 11 — ABNORMAL LOW (ref 12–28)
BUN: 12 mg/dL (ref 8–27)
Bilirubin Total: 0.5 mg/dL (ref 0.0–1.2)
CO2: 22 mmol/L (ref 20–29)
Calcium: 9.8 mg/dL (ref 8.7–10.3)
Chloride: 102 mmol/L (ref 96–106)
Creatinine, Ser: 1.07 mg/dL — ABNORMAL HIGH (ref 0.57–1.00)
Globulin, Total: 3 g/dL (ref 1.5–4.5)
Glucose: 82 mg/dL (ref 70–99)
Potassium: 4.7 mmol/L (ref 3.5–5.2)
Sodium: 139 mmol/L (ref 134–144)
Total Protein: 7.2 g/dL (ref 6.0–8.5)
eGFR: 53 mL/min/{1.73_m2} — ABNORMAL LOW (ref >59)

## 2021-06-28 LAB — CBC WITH DIFFERENTIAL/PLATELET
Basophils Absolute: 0.1 10*3/uL (ref 0.0–0.2)
Basos: 2 %
EOS (ABSOLUTE): 0.2 10*3/uL (ref 0.0–0.4)
Eos: 2 %
Hematocrit: 45.2 % (ref 34.0–46.6)
Hemoglobin: 15.7 g/dL (ref 11.1–15.9)
Immature Grans (Abs): 0 10*3/uL (ref 0.0–0.1)
Immature Granulocytes: 0 %
Lymphocytes Absolute: 3.6 10*3/uL — ABNORMAL HIGH (ref 0.7–3.1)
Lymphs: 44 %
MCH: 29.4 pg (ref 26.6–33.0)
MCHC: 34.7 g/dL (ref 31.5–35.7)
MCV: 85 fL (ref 79–97)
Monocytes Absolute: 0.6 10*3/uL (ref 0.1–0.9)
Monocytes: 8 %
Neutrophils Absolute: 3.7 10*3/uL (ref 1.4–7.0)
Neutrophils: 44 %
Platelets: 267 10*3/uL (ref 150–450)
RBC: 5.34 x10E6/uL — ABNORMAL HIGH (ref 3.77–5.28)
RDW: 13.1 % (ref 11.7–15.4)
WBC: 8.2 10*3/uL (ref 3.4–10.8)

## 2021-06-28 LAB — TSH+FREE T4
Free T4: 1.17 ng/dL (ref 0.82–1.77)
TSH: 5.59 u[IU]/mL — ABNORMAL HIGH (ref 0.450–4.500)

## 2021-06-28 LAB — LIPID PANEL
Chol/HDL Ratio: 4.2 ratio (ref 0.0–4.4)
Cholesterol, Total: 212 mg/dL — ABNORMAL HIGH (ref 100–199)
HDL: 51 mg/dL (ref >39)
LDL Chol Calc (NIH): 141 mg/dL — ABNORMAL HIGH (ref 0–99)
Triglycerides: 110 mg/dL (ref 0–149)
VLDL Cholesterol Cal: 20 mg/dL (ref 5–40)

## 2021-08-07 NOTE — Progress Notes (Unsigned)
I,Sha'taria Tyson,acting as a Education administrator for [employer], PA-C.,have documented all relevant documentation on the behalf of [name], PA-C,as directed by  [name], PA-C while in the presence of [name], PA-C.   Established patient visit   Patient: Katie Carter   DOB: [birth year]   76 y.o. Female  MRN: [id number] Visit Date: 08/08/2021  Today's healthcare provider: [name], PA-C   Cc. Chronic f/u  Subjective    HPI  Pt presents with friend and partner [name] whom she lives with. Pt has no concerns today. Hypothyroid, follow-up  Lab Results  Component Value Date   TSH 5.590 (H) 06/27/2021   TSH 6.190 (H) 03/24/2021   TSH 4.680 (H) 05/16/2020   FREET4 1.17 06/27/2021   FREET4 1.18 03/24/2021   T4TOTAL 7.5 03/23/2019    Wt Readings from Last 3 Encounters:  08/08/21 138 lb (62.6 kg)  06/27/21 137 lb 3.2 oz (62.2 kg)  03/24/21 139 lb 14.4 oz (63.5 kg)    She was last seen for hypothyroid 6 weeks ago.  Management since that visit includes continue current treatment. She reports excellent compliance with treatment. She is not having side effects.  Symptoms: No change in energy level No constipation  No diarrhea No heat / cold intolerance  No nervousness No palpitations  No weight changes    -----------------------------------------------------------------------------------------  Lipid/Cholesterol, Follow-up  Last lipid panel Other pertinent labs  Lab Results  Component Value Date   CHOL 212 (H) 06/27/2021   HDL 51 06/27/2021   LDLCALC 141 (H) 06/27/2021   TRIG 110 06/27/2021   CHOLHDL 4.2 06/27/2021   Lab Results  Component Value Date   ALT 11 06/27/2021   AST 8 06/27/2021   PLT 267 06/27/2021   TSH 5.590 (H) 06/27/2021     She was last seen for this 6 weeks ago.  Management since that visit includes continue current treatment.  She reports excellent compliance with treatment. She is not having side effects.   Symptoms: No chest pain No chest pressure/discomfort  No dyspnea No lower extremity edema  Yes numbness or tingling of extremity No orthopnea  No palpitations No paroxysmal nocturnal dyspnea  No speech difficulty No syncope   Current diet: well balanced Current exercise:  stationary bicycle  The 10-year ASCVD risk score (Arnett DK, et al., 2019) is: 28.4%  ---------------------------------------------------------------------------------------------------   Medications: Outpatient Medications Prior to Visit  Medication Sig   Fluticasone-Umeclidin-Vilant (TRELEGY ELLIPTA) 100-62.5-25 MCG/ACT AEPB Inhale 1 puff into the lungs daily.   rOPINIRole (REQUIP) 0.25 MG tablet TAKE 1 TABLET(0.25 MG) BY MOUTH AT BEDTIME   [DISCONTINUED] levothyroxine (SYNTHROID) 25 MCG tablet Take 1 tablet (25 mcg total) by mouth daily. Take in the morning at least 30 minutes before food   [DISCONTINUED] pravastatin (PRAVACHOL) 20 MG tablet Take 1 tablet (20 mg total) by mouth daily.   No facility-administered medications prior to visit.    Review of Systems  Constitutional:  Negative for fatigue and fever.  Respiratory:  Negative for cough and shortness of breath.   Cardiovascular:  Negative for chest pain and leg swelling.  Gastrointestinal:  Negative for abdominal pain.  Neurological:  Negative for dizziness and headaches.     Objective    Blood pressure 128/77, pulse 70, weight 138 lb (62.6 kg), SpO2 98 %.   Physical Exam Constitutional:      General: She is awake.     Appearance: She is well-developed.  HENT:     Head:  Normocephalic.  Eyes:     Conjunctiva/sclera: Conjunctivae normal.  Cardiovascular:     Rate and Rhythm: Normal rate.  Pulmonary:     Effort: Pulmonary effort is normal.     Breath sounds: Normal breath sounds.  Skin:    General: Skin is warm.  Neurological:     Mental Status: She is alert.     Comments: Oriented to person and vaguely to place---dr's office.   Psychiatric:        Attention and Perception: Attention normal.        Mood and Affect: Mood normal.        Speech: Speech normal.        Behavior: Behavior is cooperative.      No results found for any visits on 08/08/21.  Assessment & Plan     Problem List Items Addressed This Visit       Respiratory   CAFL (chronic airflow limitation) (HCC)    Breathing much improved with trelegy. No coughing during visit and lungs CTA. Continue. Stressed compliance, warned of continued smoking and cancer, pneumonia, bronchitis risk. Previously declined pfts, referral to pulm, lung cancer screening.        Endocrine   Adult hypothyroidism    Explained purpose of treating, at this time as pt feels well and levels are not dangerous to health, will continue to monitor and not treat. Family expressed understanding        Nervous and Auditory   Moderate dementia without behavioral disturbance, psychotic disturbance, mood disturbance, or anxiety ([place]) - Primary    MSE 9/30.  Explained to family options on to proceed- medications, referral. Largely met with silence. Pt herself is not interested in any treatment and feels well, safe in home. Partner who lives with her has no concerns when asked about care. I do believe she is being appropriately cared for at home and has support from family and friends. They do not want to proceed with treatment or evaluation of dementia symptoms. Declines assistance at home. Will discuss care directive at next appt        Other   Hypercholesteremia    Explained purpose of treatment for elevated cholesterol, heart/stroke risk.  Pt refuses medication, family not interested in going against her wishes. I believe family understands the risk, will not push further. The 10-year ASCVD risk score (Arnett DK, et al., 2019) is: 28.4%       Restless leg    Manages with ropinirole. Takes in AM family has been unable to switch the timing of this medication.  Will  continue.        Return in about 4 months (around 12/08/2021) for chronic conditions, discuss advanced directive.      I, [name], PA-C have reviewed all documentation for this visit. The documentation on  08/08/2021 for the exam, diagnosis, procedures, and orders are all accurate and complete.  [name], PA-C [facility] [address] [place], [place], [address] Office: [phone] Fax: [place]

## 2021-08-08 ENCOUNTER — Ambulatory Visit (INDEPENDENT_AMBULATORY_CARE_PROVIDER_SITE_OTHER): Payer: Medicare Other | Admitting: Physician Assistant

## 2021-08-08 ENCOUNTER — Encounter: Payer: Self-pay | Admitting: Physician Assistant

## 2021-08-08 VITALS — BP 128/77 | HR 70 | Wt 138.0 lb

## 2021-08-08 DIAGNOSIS — J449 Chronic obstructive pulmonary disease, unspecified: Secondary | ICD-10-CM

## 2021-08-08 DIAGNOSIS — G2581 Restless legs syndrome: Secondary | ICD-10-CM | POA: Diagnosis not present

## 2021-08-08 DIAGNOSIS — E039 Hypothyroidism, unspecified: Secondary | ICD-10-CM

## 2021-08-08 DIAGNOSIS — F03B0 Unspecified dementia, moderate, without behavioral disturbance, psychotic disturbance, mood disturbance, and anxiety: Secondary | ICD-10-CM | POA: Diagnosis not present

## 2021-08-08 DIAGNOSIS — E7800 Pure hypercholesterolemia, unspecified: Secondary | ICD-10-CM

## 2021-08-08 NOTE — Assessment & Plan Note (Addendum)
MSE 9/30.  Explained to family options on to proceed- medications, referral. Largely met with silence. Pt herself is not interested in any treatment and feels well, safe in home. Partner who lives with her has no concerns when asked about care. I do believe she is being appropriately cared for at home and has support from family and friends. They do not want to proceed with treatment or evaluation of dementia symptoms. Declines assistance at home. Will discuss care directive at next appt 

## 2021-08-08 NOTE — Assessment & Plan Note (Addendum)
Breathing much improved with trelegy. No coughing during visit and lungs CTA. Continue. Stressed compliance, warned of continued smoking and cancer, pneumonia, bronchitis risk. Previously declined pfts, referral to pulm, lung cancer screening.

## 2021-08-08 NOTE — Assessment & Plan Note (Signed)
Manages with ropinirole. Takes in AM family has been unable to switch the timing of this medication.  Will continue.

## 2021-08-08 NOTE — Assessment & Plan Note (Signed)
Explained purpose of treatment for elevated cholesterol, heart/stroke risk.  Pt refuses medication, family not interested in going against her wishes. I believe family understands the risk, will not push further. The 10-year ASCVD risk score (Arnett DK, et al., 2019) is: 28.4%

## 2021-08-08 NOTE — Assessment & Plan Note (Addendum)
Explained purpose of treating, at this time as pt feels well and levels are not dangerous to health, will continue to monitor and not treat. Family expressed understanding

## 2021-08-10 ENCOUNTER — Telehealth: Payer: Self-pay | Admitting: Physician Assistant

## 2021-08-10 NOTE — Telephone Encounter (Signed)
[facility] faxed refill request for the following medications:   simvastatin (ZOCOR) 20 MG tablet   Please advise.  

## 2021-09-03 ENCOUNTER — Other Ambulatory Visit: Payer: Self-pay | Admitting: Physician Assistant

## 2021-09-04 ENCOUNTER — Encounter: Payer: Self-pay | Admitting: Podiatry

## 2021-09-04 ENCOUNTER — Ambulatory Visit (INDEPENDENT_AMBULATORY_CARE_PROVIDER_SITE_OTHER): Payer: Medicare Other | Admitting: Podiatry

## 2021-09-04 DIAGNOSIS — M79674 Pain in right toe(s): Secondary | ICD-10-CM | POA: Diagnosis not present

## 2021-09-04 DIAGNOSIS — B351 Tinea unguium: Secondary | ICD-10-CM | POA: Diagnosis not present

## 2021-09-04 DIAGNOSIS — M79675 Pain in left toe(s): Secondary | ICD-10-CM | POA: Diagnosis not present

## 2021-09-04 DIAGNOSIS — M2041 Other hammer toe(s) (acquired), right foot: Secondary | ICD-10-CM | POA: Diagnosis not present

## 2021-09-04 NOTE — Progress Notes (Signed)
This patient presents to the office with chief complaint of long thick painful nails.  Patient says the nails are painful walking and wearing shoes.  This patient is unable to self treat.  This patient is unable to trim her nails since she is unable to reach her nails. She is also concerned about a skin lesion due to her contracted second toe right foot. She presents to the office for preventative foot care services.  General Appearance  Alert, conversant and in no acute stress.  Vascular  Dorsalis pedis and posterior tibial  pulses are palpable  bilaterally.  Capillary return is within normal limits  bilaterally. Temperature is within normal limits  bilaterally.  Neurologic  Senn-Weinstein monofilament wire test within normal limits  bilaterally. Muscle power within normal limits bilaterally.  Nails Thick disfigured discolored nails with subungual debris  from hallux to fifth toes bilaterally. No evidence of bacterial infection or drainage bilaterally.  Orthopedic  No limitations of motion  feet .  No crepitus or effusions noted. HAV  B/L with hammer toes  2  B/L.  Skin  normotropic skin with no porokeratosis noted bilaterally.  No signs of infections or ulcers noted.     Onychomycosis  Nails  B/L.  Pain in right toes  Pain in left toes  Hammer toe second toe  B/L.  Debridement of nails both feet followed trimming the nails with dremel tool.   Discussed hammer toe with this patient.    RTC 3 months.   Shadiamond Koska DPM   

## 2021-09-04 NOTE — Telephone Encounter (Signed)
Requested medications are due for refill today.  unsure  Requested medications are on the active medications list.  no  Last refill. none  Future visit scheduled.   yes  Notes to clinic.  It does not look like this med has ever been prescribed for this pt.    Requested Prescriptions  Pending Prescriptions Disp Refills   [place] 2.5 MCG/ACT AERS [Pharmacy Med Name: SPIRIVA RESPIMAT 2.5MCG [place] 4GM 60D] 4 g     Sig: INHALE 2 PUFFS BY MOUTH INTO LUNGS DAILY     Pulmonology:  Anticholinergic Agents Passed - 09/03/2021  5:10 PM      Passed - Valid encounter within last 12 months    Recent Outpatient Visits           3 weeks ago Moderate dementia without behavioral disturbance, psychotic disturbance, mood disturbance, or anxiety, unspecified dementia type [employer])   [facility] [name], PA-C   2 months ago Adult hypothyroidism   [facility] Thedore Mins, [place] Wheeler, PA-C   6 months ago Need for influenza vaccination   [place], DO   1 year ago Chest pain, unspecified type   [place], Vickki Muff, PA-C   1 year ago Adult hypothyroidism   [employer], Vickki Muff, PA-C       Future Appointments             In 3 months Drubel, Ria Comment, PA-C [employer], [place]

## 2021-10-01 ENCOUNTER — Other Ambulatory Visit: Payer: Self-pay | Admitting: Physician Assistant

## 2021-10-28 ENCOUNTER — Other Ambulatory Visit: Payer: Self-pay | Admitting: Physician Assistant

## 2021-10-28 DIAGNOSIS — J449 Chronic obstructive pulmonary disease, unspecified: Secondary | ICD-10-CM

## 2021-12-07 NOTE — Progress Notes (Signed)
Established patient visit   Patient: Katie Carter   DOB: January 07, 1946   76 y.o. Female  MRN: [id number] Visit Date: 12/08/2021  Today's healthcare provider: [name], PA-C   Cc. Chronic care follow up   Subjective    HPI  She is accompanied by her partner [name] today. He states her dementia is worsening. Reports sometimes she will be lying in bed and complain of pain along her ribs. Usually improved after walking around. Pt is unable to attest herself to symptoms. Pt denies any pain or discomfort today. Denies change in cough, sob, wheezing.   Hypothyroid, follow-up  Lab Results  Component Value Date   TSH 5.590 (H) 06/27/2021   TSH 6.190 (H) 03/24/2021   TSH 4.680 (H) 05/16/2020   FREET4 1.17 06/27/2021   FREET4 1.18 03/24/2021   T4TOTAL 7.5 03/23/2019    Wt Readings from Last 3 Encounters:  12/08/21 140 lb 8 oz (63.7 kg)  08/08/21 138 lb (62.6 kg)  06/27/21 137 lb 3.2 oz (62.2 kg)    She was last seen for hypothyroid 3 months ago.  Management since that visit includes medication. She reports excellent compliance with treatment. She is not having side effects.   Symptoms: No change in energy level No constipation  No diarrhea No heat / cold intolerance  No nervousness No palpitations  No weight changes    ---------------------------------------------------------------------------------------------------  -----------------------------------------------------------------------------------------   Medications: Outpatient Medications Prior to Visit  Medication Sig   rOPINIRole (REQUIP) 0.25 MG tablet TAKE 1 TABLET(0.25 MG) BY MOUTH AT BEDTIME   [DISCONTINUED] TRELEGY ELLIPTA 100-62.5-25 MCG/ACT AEPB INHALE 1 PUFF INTO THE LUNGS DAILY   No facility-administered medications prior to visit.   The 10-year ASCVD risk score (Arnett DK, et al., 2019) is:  25.9%  ---------------------------------------------------------------------------------------------------   Review of Systems  Constitutional:  Negative for fatigue and fever.  Respiratory:  Positive for cough. Negative for shortness of breath.   Cardiovascular:  Negative for chest pain and leg swelling.  Gastrointestinal:  Negative for abdominal pain.  Neurological:  Negative for dizziness and headaches.      Objective    Blood pressure (!) 121/98, pulse 67, temperature 97.9 F (36.6 C), temperature source Oral, weight 140 lb 8 oz (63.7 kg), SpO2 99 %.   Physical Exam Constitutional:      General: She is awake.     Appearance: She is well-developed.  HENT:     Head: Normocephalic.  Eyes:     Conjunctiva/sclera: Conjunctivae normal.  Cardiovascular:     Rate and Rhythm: Normal rate and regular rhythm.     Heart sounds: Normal heart sounds.  Pulmonary:     Effort: Pulmonary effort is normal. No respiratory distress.     Breath sounds: Normal breath sounds. No stridor. No wheezing, rhonchi or rales.     Comments: Pt coughs occasionally. Skin:    General: Skin is warm.  Neurological:     Mental Status: She is alert and oriented to person, place, and time.  Psychiatric:        Attention and Perception: Attention normal.        Mood and Affect: Mood normal.        Speech: Speech normal.        Behavior: Behavior is cooperative.      No results found for any visits on 12/08/21.  Assessment & Plan     Problem List Items Addressed This Visit       Respiratory  CAFL (chronic airflow limitation) (HCC) - Primary    Treating empirically as COPD given smoking history. Pt has repeatedly declined pfts and pulm referral. Denies lung cancer screening.  Lungs cta today, consistent with trelegy use.  Unsure if occasional night symptoms are related to this, do not seem related to any CHF given her euvolemic status. Given strict instructions -- any worsening/new cough,  persistent pain, fever, increased sob/wheezing to call office and would order CXR.  Recommended rsv vaccine.      Relevant Medications   Fluticasone-Umeclidin-Vilant (TRELEGY ELLIPTA) 100-62.5-25 MCG/ACT AEPB   Other Relevant Orders   CBC w/Diff/Platelet     Endocrine   Adult hypothyroidism    Will occasionally monitor for any worsening, but at this point pt prefers to remain unmedicated       Relevant Orders   TSH + free T4     Nervous and Auditory   Moderate dementia without behavioral disturbance, psychotic disturbance, mood disturbance, or anxiety ([place])    No mse today but does seem to be worsening Had a frank discussion with partner [name] today, who is patient's primary caregiver.  Advised we can always refer to social work, we can coordinate resources for care in the home, among other options. Pt was again advised we can refer to neuro, try meds, all declined.  Family declines referral for neuro or social work today.         Genitourinary   Chronic kidney disease, stage 3a ([place])    Will continue to monitor cmp q 6 mo      Relevant Orders   Comprehensive Metabolic Panel (CMET)   Other Visit Diagnoses     Need for immunization against influenza       Relevant Orders   Flu Vaccine QUAD High Dose(Fluad) (Completed)   Encounter for immunization       Relevant Orders   Pfizer Fall 2023 Covid-19 Vaccine 66yr and older (Completed)       Return in about 6 months (around 06/09/2022) for chronic conditions.     I, LMikey Kirschner PA-C have reviewed all documentation for this visit. The documentation on 12/08/2021 for the exam, diagnosis, procedures, and orders are all accurate and complete.  LMikey Kirschner PA-C [facility] [place] [address]Office: [phone]: [place]

## 2021-12-07 NOTE — Progress Notes (Deleted)
      Established patient visit   Patient: Katie Carter   DOB: 1945-04-14   76 y.o. Female  MRN: [id number] Visit Date: 12/08/2021  Today's healthcare provider: [name], PA-C   No chief complaint on file.  Subjective    HPI  Diabetes Mellitus Type II, Follow-up  Lab Results  Component Value Date   HGBA1C 5.5 11/05/2017   Wt Readings from Last 3 Encounters:  08/08/21 138 lb (62.6 kg)  06/27/21 137 lb 3.2 oz (62.2 kg)  03/24/21 139 lb 14.4 oz (63.5 kg)   Last seen for diabetes {1-12:18279} {days/wks/mos/yrs:310907} ago.  Management since then includes ***. She reports {excellent/good/fair/poor:19665} compliance with treatment. She {is/is not:21021397} having side effects. {document side effects if present:1} Symptoms: {Yes/No:20286} fatigue {Yes/No:20286} foot ulcerations  {Yes/No:20286} appetite changes {Yes/No:20286} nausea  {Yes/No:20286} paresthesia of the feet  {Yes/No:20286} polydipsia  {Yes/No:20286} polyuria {Yes/No:20286} visual disturbances   {Yes/No:20286} vomiting     Home blood sugar records: {diabetes glucometry results:16657}  Episodes of hypoglycemia? {Yes/No:20286} {enter symptoms and frequency of symptoms if yes:1}   Current insulin regiment: {enter 'none' or type of insulin and number of units taken with each dose of each insulin formulation that the patient is taking:1} Most Recent Eye Exam: *** {Current exercise:16438:::1} {Current diet habits:16563:::1}  Pertinent Labs: Lab Results  Component Value Date   CHOL 212 (H) 06/27/2021   HDL 51 06/27/2021   LDLCALC 141 (H) 06/27/2021   TRIG 110 06/27/2021   CHOLHDL 4.2 06/27/2021   Lab Results  Component Value Date   NA 139 06/27/2021   K 4.7 06/27/2021   CREATININE 1.07 (H) 06/27/2021   EGFR 53 (L) 06/27/2021     ---------------------------------------------------------------------------------------------------   Medications: Outpatient Medications Prior to Visit  Medication Sig    rOPINIRole (REQUIP) 0.25 MG tablet TAKE 1 TABLET(0.25 MG) BY MOUTH AT BEDTIME   TRELEGY ELLIPTA 100-62.5-25 MCG/ACT AEPB INHALE 1 PUFF INTO THE LUNGS DAILY   No facility-administered medications prior to visit.    Review of Systems  {Labs  Heme  Chem  Endocrine  Serology  Results Review (optional):23779}   Objective    There were no vitals taken for this visit. {Show previous vital signs (optional):23777}  Physical Exam  ***  No results found for any visits on 12/08/21.  Assessment & Plan     ***  No follow-ups on file.      {provider attestation***:1}   [name], PA-C  [facility] [phone] (phone) [phone] (fax)  [place]

## 2021-12-08 ENCOUNTER — Ambulatory Visit (INDEPENDENT_AMBULATORY_CARE_PROVIDER_SITE_OTHER): Payer: Medicare Other | Admitting: Physician Assistant

## 2021-12-08 ENCOUNTER — Encounter: Payer: Self-pay | Admitting: Physician Assistant

## 2021-12-08 VITALS — BP 121/98 | HR 67 | Temp 97.9°F | Wt 140.5 lb

## 2021-12-08 DIAGNOSIS — Z23 Encounter for immunization: Secondary | ICD-10-CM | POA: Diagnosis not present

## 2021-12-08 DIAGNOSIS — J449 Chronic obstructive pulmonary disease, unspecified: Secondary | ICD-10-CM

## 2021-12-08 DIAGNOSIS — E039 Hypothyroidism, unspecified: Secondary | ICD-10-CM

## 2021-12-08 DIAGNOSIS — N1831 Chronic kidney disease, stage 3a: Secondary | ICD-10-CM

## 2021-12-08 DIAGNOSIS — F03B0 Unspecified dementia, moderate, without behavioral disturbance, psychotic disturbance, mood disturbance, and anxiety: Secondary | ICD-10-CM | POA: Diagnosis not present

## 2021-12-08 MED ORDER — TRELEGY ELLIPTA 100-62.5-25 MCG/ACT IN AEPB: 1.0000 | INHALATION_SPRAY | Freq: Every day | RESPIRATORY_TRACT | 3 refills | Status: DC

## 2021-12-08 NOTE — Assessment & Plan Note (Signed)
Will continue to monitor cmp q 6 mo

## 2021-12-08 NOTE — Assessment & Plan Note (Signed)
Treating empirically as COPD given smoking history. Pt has repeatedly declined pfts and pulm referral. Denies lung cancer screening.  Lungs cta today, consistent with trelegy use.  Unsure if occasional night symptoms are related to this, do not seem related to any CHF given her euvolemic status. Given strict instructions -- any worsening/new cough, persistent pain, fever, increased sob/wheezing to call office and would order CXR.  Recommended rsv vaccine.

## 2021-12-08 NOTE — Assessment & Plan Note (Signed)
Will occasionally monitor for any worsening, but at this point pt prefers to remain unmedicated

## 2021-12-08 NOTE — Assessment & Plan Note (Signed)
No mse today but does seem to be worsening Had a frank discussion with partner [name] today, who is patient's primary caregiver.  Advised we can always refer to social work, we can coordinate resources for care in the home, among other options. Pt was again advised we can refer to neuro, try meds, all declined.  Family declines referral for neuro or social work today.

## 2021-12-11 ENCOUNTER — Encounter: Payer: Self-pay | Admitting: Podiatry

## 2021-12-11 ENCOUNTER — Ambulatory Visit (INDEPENDENT_AMBULATORY_CARE_PROVIDER_SITE_OTHER): Payer: Medicare Other | Admitting: Podiatry

## 2021-12-11 VITALS — BP 128/70 | HR 68

## 2021-12-11 DIAGNOSIS — M2041 Other hammer toe(s) (acquired), right foot: Secondary | ICD-10-CM | POA: Diagnosis not present

## 2021-12-11 DIAGNOSIS — B351 Tinea unguium: Secondary | ICD-10-CM

## 2021-12-11 NOTE — Progress Notes (Signed)
This patient presents to the office with chief complaint of long thick painful nails.  Patient says the nails are painful walking and wearing shoes.  This patient is unable to self treat.  This patient is unable to trim her nails since she is unable to reach her nails. She is also concerned about a skin lesion due to her contracted second toe right foot. She presents to the office for preventative foot care services.  General Appearance  Alert, conversant and in no acute stress.  Vascular  Dorsalis pedis and posterior tibial  pulses are palpable  bilaterally.  Capillary return is within normal limits  bilaterally. Temperature is within normal limits  bilaterally.  Neurologic  Senn-Weinstein monofilament wire test within normal limits  bilaterally. Muscle power within normal limits bilaterally.  Nails Thick disfigured discolored nails with subungual debris  from hallux to fifth toes bilaterally. No evidence of bacterial infection or drainage bilaterally.  Orthopedic  No limitations of motion  feet .  No crepitus or effusions noted. HAV  B/L with hammer toes  2  B/L.  Skin  normotropic skin with no porokeratosis noted bilaterally.  No signs of infections or ulcers noted.     Onychomycosis  Nails  B/L.  Pain in right toes  Pain in left toes  Hammer toe second toe  B/L.  Debridement of nails both feet followed trimming the nails with dremel tool.   Discussed hammer toe with this patient.    RTC 3 months.   Gardiner Barefoot DPM

## 2022-02-07 ENCOUNTER — Ambulatory Visit (INDEPENDENT_AMBULATORY_CARE_PROVIDER_SITE_OTHER): Payer: 59 | Admitting: Physician Assistant

## 2022-02-07 ENCOUNTER — Encounter: Payer: Self-pay | Admitting: Physician Assistant

## 2022-02-07 VITALS — BP 135/72 | HR 70 | Temp 97.8°F | Ht 62.0 in | Wt 141.7 lb

## 2022-02-07 DIAGNOSIS — R1012 Left upper quadrant pain: Secondary | ICD-10-CM | POA: Diagnosis not present

## 2022-02-07 DIAGNOSIS — R112 Nausea with vomiting, unspecified: Secondary | ICD-10-CM | POA: Diagnosis not present

## 2022-02-07 LAB — POCT URINALYSIS DIPSTICK
Blood, UA: NEGATIVE
Glucose, UA: NEGATIVE
Leukocytes, UA: NEGATIVE
Protein, UA: POSITIVE — AB
Spec Grav, UA: 1.01 (ref 1.010–1.025)
Urobilinogen, UA: 0.2 E.U./dL
pH, UA: 7 (ref 5.0–8.0)

## 2022-02-07 MED ORDER — ONDANSETRON HCL 4 MG PO TABS: 4.0000 mg | ORAL_TABLET | Freq: Three times a day (TID) | ORAL | 0 refills | Status: DC | PRN

## 2022-02-07 MED ORDER — PANTOPRAZOLE SODIUM 40 MG PO TBEC: 40.0000 mg | DELAYED_RELEASE_TABLET | Freq: Every day | ORAL | 3 refills | Status: DC

## 2022-02-07 NOTE — Progress Notes (Signed)
Established patient visit   Patient: Katie Carter   DOB: 1945/12/28   77 y.o. Female  MRN: [id number] Visit Date: 02/07/2022  Today's healthcare provider: [name], PA-C   Cc. Abdominal pain, nausea, vomiting  Subjective    HPI   Patient presents today with her partner who supplies some history.  Patient states she has left-sided flank/abdominal pain for the last week associated with nausea and vomiting.  She has been unable to keep any food down, her partner thinks that there may have been some blood in the last time she vomited.  Reports drinking some water, coffee and soda.  Denies any radiation of pain denies any urinary symptoms, frequency, dysuria. Denies fevers. Denies change in BM color, frequency, denies blood in stool.    Medications: Outpatient Medications Prior to Visit  Medication Sig   Fluticasone-Umeclidin-Vilant (TRELEGY ELLIPTA) 100-62.5-25 MCG/ACT AEPB Inhale 1 puff into the lungs daily.   rOPINIRole (REQUIP) 0.25 MG tablet TAKE 1 TABLET(0.25 MG) BY MOUTH AT BEDTIME   No facility-administered medications prior to visit.    Review of Systems  Gastrointestinal:  Positive for abdominal pain, nausea and vomiting.      Objective    Blood pressure 135/72, pulse 70, temperature 97.8 F (36.6 C), height '5\' 2"'$  (1.575 m), weight 141 lb 11.2 oz (64.3 kg), SpO2 97 %.   Physical Exam Constitutional:      General: She is awake.     Appearance: She is well-developed.  HENT:     Head: Normocephalic.  Eyes:     Conjunctiva/sclera: Conjunctivae normal.  Cardiovascular:     Rate and Rhythm: Normal rate and regular rhythm.     Heart sounds: Normal heart sounds.  Pulmonary:     Effort: Pulmonary effort is normal.     Breath sounds: Normal breath sounds.  Abdominal:     Palpations: Abdomen is soft.     Tenderness: There is abdominal tenderness in the left upper quadrant.     Comments: Pt is acutely tender to LUQ and L flank. NO CVA tenderness  bilaterally  Skin:    General: Skin is warm.  Neurological:     Mental Status: She is alert and oriented to person, place, and time.  Psychiatric:        Attention and Perception: Attention normal.        Mood and Affect: Mood normal.        Speech: Speech normal.        Behavior: Behavior is cooperative.     Results for orders placed or performed in visit on 02/07/22  POCT Urinalysis Dipstick  Result Value Ref Range   Color, UA yellow    Clarity, UA clear    Glucose, UA Negative Negative   Bilirubin, UA small    Ketones, UA meg    Spec Grav, UA 1.010 1.010 - 1.025   Blood, UA neg    pH, UA 7.0 5.0 - 8.0   Protein, UA Positive (A) Negative   Urobilinogen, UA 0.2 0.2 or 1.0 E.U./dL   Nitrite, UA nrg    Leukocytes, UA Negative Negative   Appearance     Odor      Assessment & Plan     Nausea and vomiting 2. Abdominal pain UA -Leuk, blood, nitrites  Rx pantoprazole in AM rx zofran for nausea Ordered cbc/cmp/lipase  Suspicion for gastritis vs PUD. Given strict ED precautions surrounding dehydration and vomiting blood.  Return if symptoms worsen or  fail to improve.      I, [name], PA-C have reviewed all documentation for this visit. The documentation on  02/07/22  for the exam, diagnosis, procedures, and orders are all accurate and complete.  [name], PA-C [facility] [address] [place], [place], [address] Office: [phone] Fax: [place]

## 2022-02-08 LAB — LIPASE: Lipase: 26 U/L (ref 14–85)

## 2022-02-08 LAB — CBC WITH DIFFERENTIAL/PLATELET
Basophils Absolute: 0.2 10*3/uL (ref 0.0–0.2)
Basos: 2 %
EOS (ABSOLUTE): 0.1 10*3/uL (ref 0.0–0.4)
Eos: 1 %
Hematocrit: 46.9 % — ABNORMAL HIGH (ref 34.0–46.6)
Hemoglobin: 15.7 g/dL (ref 11.1–15.9)
Immature Grans (Abs): 0 10*3/uL (ref 0.0–0.1)
Immature Granulocytes: 0 %
Lymphocytes Absolute: 4 10*3/uL — ABNORMAL HIGH (ref 0.7–3.1)
Lymphs: 41 %
MCH: 28.9 pg (ref 26.6–33.0)
MCHC: 33.5 g/dL (ref 31.5–35.7)
MCV: 86 fL (ref 79–97)
Monocytes Absolute: 0.8 10*3/uL (ref 0.1–0.9)
Monocytes: 9 %
Neutrophils Absolute: 4.7 10*3/uL (ref 1.4–7.0)
Neutrophils: 47 %
Platelets: 274 10*3/uL (ref 150–450)
RBC: 5.43 x10E6/uL — ABNORMAL HIGH (ref 3.77–5.28)
RDW: 12.7 % (ref 11.7–15.4)
WBC: 9.8 10*3/uL (ref 3.4–10.8)

## 2022-02-08 LAB — COMPREHENSIVE METABOLIC PANEL
ALT: 13 IU/L (ref 0–32)
AST: 11 IU/L (ref 0–40)
Albumin/Globulin Ratio: 1.4 (ref 1.2–2.2)
Albumin: 4.3 g/dL (ref 3.8–4.8)
Alkaline Phosphatase: 81 IU/L (ref 44–121)
BUN/Creatinine Ratio: 13 (ref 12–28)
BUN: 15 mg/dL (ref 8–27)
Bilirubin Total: 0.5 mg/dL (ref 0.0–1.2)
CO2: 23 mmol/L (ref 20–29)
Calcium: 9.6 mg/dL (ref 8.7–10.3)
Chloride: 100 mmol/L (ref 96–106)
Creatinine, Ser: 1.13 mg/dL — ABNORMAL HIGH (ref 0.57–1.00)
Globulin, Total: 3 g/dL (ref 1.5–4.5)
Glucose: 87 mg/dL (ref 70–99)
Potassium: 4.4 mmol/L (ref 3.5–5.2)
Sodium: 138 mmol/L (ref 134–144)
Total Protein: 7.3 g/dL (ref 6.0–8.5)
eGFR: 49 mL/min/{1.73_m2} — ABNORMAL LOW (ref >59)

## 2022-02-27 ENCOUNTER — Telehealth: Payer: Self-pay | Admitting: Physician Assistant

## 2022-02-27 NOTE — Telephone Encounter (Signed)
Contacted Katie Carter to schedule their annual wellness visit. Appointment made for 03/28/2022.  [place] Direct Dial: [phone]

## 2022-03-14 DIAGNOSIS — H2512 Age-related nuclear cataract, left eye: Secondary | ICD-10-CM | POA: Diagnosis not present

## 2022-03-14 DIAGNOSIS — Z961 Presence of intraocular lens: Secondary | ICD-10-CM | POA: Diagnosis not present

## 2022-03-14 DIAGNOSIS — H353132 Nonexudative age-related macular degeneration, bilateral, intermediate dry stage: Secondary | ICD-10-CM | POA: Diagnosis not present

## 2022-03-15 ENCOUNTER — Ambulatory Visit (INDEPENDENT_AMBULATORY_CARE_PROVIDER_SITE_OTHER): Payer: 59 | Admitting: Podiatry

## 2022-03-15 ENCOUNTER — Encounter: Payer: Self-pay | Admitting: Podiatry

## 2022-03-15 DIAGNOSIS — M79674 Pain in right toe(s): Secondary | ICD-10-CM | POA: Diagnosis not present

## 2022-03-15 DIAGNOSIS — M79675 Pain in left toe(s): Secondary | ICD-10-CM

## 2022-03-15 DIAGNOSIS — N1831 Chronic kidney disease, stage 3a: Secondary | ICD-10-CM

## 2022-03-15 DIAGNOSIS — B351 Tinea unguium: Secondary | ICD-10-CM | POA: Diagnosis not present

## 2022-03-15 NOTE — Progress Notes (Signed)
This patient presents to the office with chief complaint of long thick painful nails.  Patient says the nails are painful walking and wearing shoes.  This patient is unable to self treat.  This patient is unable to trim her nails since she is unable to reach her nails.  She presents to the office for preventative foot care services.  General Appearance  Alert, conversant and in no acute stress.  Vascular  Dorsalis pedis and posterior tibial  pulses are palpable  bilaterally.  Capillary return is within normal limits  bilaterally. Temperature is within normal limits  bilaterally.  Neurologic  Senn-Weinstein monofilament wire test within normal limits  bilaterally. Muscle power within normal limits bilaterally.  Nails Thick disfigured discolored nails with subungual debris  from hallux to fifth toes bilaterally. No evidence of bacterial infection or drainage bilaterally.  Orthopedic  No limitations of motion  feet .  No crepitus or effusions noted. Hammer toes  B/L.  Skin  normotropic skin with no porokeratosis noted bilaterally.  No signs of infections or ulcers noted.     Onychomycosis  Nails  B/L.  Pain in right toes  Pain in left toes  Hammer toe second toe  B/L.  Debridement of nails both feet followed trimming the nails with nail nipper  and  dremel tool.      RTC 3 months.   Gardiner Barefoot DPM

## 2022-03-22 DIAGNOSIS — H2512 Age-related nuclear cataract, left eye: Secondary | ICD-10-CM | POA: Diagnosis not present

## 2022-03-22 DIAGNOSIS — H353131 Nonexudative age-related macular degeneration, bilateral, early dry stage: Secondary | ICD-10-CM | POA: Diagnosis not present

## 2022-03-28 ENCOUNTER — Ambulatory Visit (INDEPENDENT_AMBULATORY_CARE_PROVIDER_SITE_OTHER): Payer: 59

## 2022-03-28 VITALS — BP 122/70 | Ht 64.0 in | Wt 146.0 lb

## 2022-03-28 DIAGNOSIS — Z0000 Encounter for general adult medical examination without abnormal findings: Secondary | ICD-10-CM

## 2022-03-28 NOTE — Progress Notes (Signed)
Subjective:   Katie Carter is a 77 y.o. female who presents for Medicare Annual (Subsequent) preventive examination.  Review of Systems    Cardiac Risk Factors include: advanced age (>72men, >36 women);dyslipidemia;sedentary lifestyle;smoking/ tobacco exposure    Objective:    Today's Vitals   03/28/22 0806  Weight: 146 lb (66.2 kg)  Height: 5\' 4"  (1.626 m)   Body mass index is 25.06 kg/m.     03/28/2022    8:28 AM 03/22/2020   10:00 AM 03/19/2019    9:31 AM 03/18/2018   10:06 AM 11/20/2017    7:45 PM 11/20/2017    6:32 AM 11/11/2017    3:04 PM  Advanced Directives  Does Patient Have a Medical Advance Directive? No No No No  No No  Would patient like information on creating a medical advance directive?  No - Patient declined No - Patient declined No - Patient declined No - Patient declined  No - Patient declined    Current Medications (verified) Outpatient Encounter Medications as of 03/28/2022  Medication Sig   Fluticasone-Umeclidin-Vilant (TRELEGY ELLIPTA) 100-62.5-25 MCG/ACT AEPB Inhale 1 puff into the lungs daily.   pantoprazole (PROTONIX) 40 MG tablet Take 1 tablet (40 mg total) by mouth daily. Take in the morning before breakfast   rOPINIRole (REQUIP) 0.25 MG tablet TAKE 1 TABLET(0.25 MG) BY MOUTH AT BEDTIME   ondansetron (ZOFRAN) 4 MG tablet Take 1 tablet (4 mg total) by mouth every 8 (eight) hours as needed for nausea or vomiting. (Patient not taking: Reported on 03/28/2022)   No facility-administered encounter medications on file as of 03/28/2022.    Allergies (verified) Meloxicam, Aspirin, and Tramadol   History: Past Medical History:  Diagnosis Date   Arthritis    Chronic airway obstruction ([place])    Dyspnea    Hypercholesteremia    Lumbago 05/06/2006   Thyroid disease 10/15/2005   Tobacco use disorder    Past Surgical History:  Procedure Laterality Date   ABDOMINAL HYSTERECTOMY  1960   abdominal Hysterectomy with bilateral salpingo-oophorectomy    APPENDECTOMY     CATARACT EXTRACTION Right 10/2012   Surgical Fixation  1981   of the left knee fracture   TOTAL KNEE ARTHROPLASTY Left 11/20/2017   Procedure: TOTAL KNEE ARTHROPLASTY hardware removal;  Surgeon: Lovell Sheehan, MD;  Location: [facility] ORS;  Service: Orthopedics;  Laterality: Left;   Family History  Problem Relation Age of Onset   Alzheimer's disease Mother    Cancer Brother    Seizures Daughter    Social History   Socioeconomic History   Marital status: Significant Other    Spouse name: divorced   Number of children: 2   Years of education: Not on file   Highest education level: 9th grade  Occupational History   Occupation: retired  Tobacco Use   Smoking status: Every Day    Packs/day: 1.00    Years: 50.00    Additional pack years: 0.00    Total pack years: 50.00    Types: Cigarettes   Smokeless tobacco: Never   Tobacco comments:    "Has been thinking about quitting" 03/2020  Vaping Use   Vaping Use: Never used  Substance and Sexual Activity   Alcohol use: Not Currently    Comment: Occasionally a couple beers/monthly   Drug use: No   Sexual activity: Not on file  Other Topics Concern   Not on file  Social History Narrative   1 daughter living and has 1 deceased.  Social Determinants of Health   Financial Resource Strain: Low Risk  (03/28/2022)   Overall Financial Resource Strain (CARDIA)    Difficulty of Paying Living Expenses: Not hard at all  Food Insecurity: No Food Insecurity (03/28/2022)   Hunger Vital Sign    Worried About Running Out of Food in the Last Year: Never true    Ran Out of Food in the Last Year: Never true  Transportation Needs: No Transportation Needs (03/28/2022)   PRAPARE - Hydrologist (Medical): No    Lack of Transportation (Non-Medical): No  Physical Activity: Inactive (03/28/2022)   Exercise Vital Sign    Days of Exercise per Week: 0 days    Minutes of Exercise per Session: 0 min  Stress: No  Stress Concern Present (03/28/2022)   [place]    Feeling of Stress : Only a little  Social Connections: Moderately Isolated (03/28/2022)   Social Connection and Isolation Panel [NHANES]    Frequency of Communication with Friends and Family: Twice a week    Frequency of Social Gatherings with Friends and Family: Once a week    Attends Religious Services: Never    Marine scientist or Organizations: No    Attends Music therapist: Never    Marital Status: Living with partner    Tobacco Counseling Ready to quit: Not Answered Counseling given: Not Answered Tobacco comments: "Has been thinking about quitting" 03/2020   Clinical Intake:  Pre-visit preparation completed: Yes  Pain : No/denies pain     BMI - recorded: 25.06 Nutritional Status: BMI 25 -29 Overweight Nutritional Risks: None Diabetes: No  How often do you need to have someone help you when you read instructions, pamphlets, or other written materials from your doctor or pharmacy?: 1 - Never  Diabetic?no  Interpreter Needed?: No  Comments: lives with partner Information entered by :: B.Purvis Sidle,LPN   Activities of Daily Living    03/28/2022    8:27 AM 12/08/2021    9:33 AM  In your present state of health, do you have any difficulty performing the following activities:  Hearing? 0 0  Vision? 1 0  Difficulty concentrating or making decisions? 1 1  Walking or climbing stairs? 1 1  Dressing or bathing? 0 1  Doing errands, shopping? 1 0  Comment partner helps her   Preparing Food and eating ? N   Using the Toilet? N   In the past six months, have you accidently leaked urine? N   Do you have problems with loss of bowel control? N   Managing your Medications? Y   Managing your Finances? Y   Housekeeping or managing your Housekeeping? Y     Patient Care Team: [name], PA-C as PCP - General (Physician  Assistant)  Indicate any recent Medical Services you may have received from other than [facility] providers in the past year (date may be approximate).     Assessment:   This is a routine wellness examination for Katie Carter.  Hearing/Vision screen Hearing Screening - Comments:: Adequate hearing Vision Screening - Comments:: Needs left eye cataract surgery-TBD by [facility]-waiting a call Inadequate vision in lft eye w/glasses (due to cataract)  Dietary issues and exercise activities discussed: Current Exercise Habits: The patient does not participate in regular exercise at present, Exercise limited by: neurologic condition(s);orthopedic condition(s)   Goals Addressed             This Visit's  Progress    DIET - INCREASE WATER INTAKE   On track    Recommend to drink at least 6-8 8oz glasses of water per day.     Exercise 3x per week (30 min per time)   Not on track    Recommend to start exercising 3 days a week for at least minutes a time. Pt to start exercising when the weather warms up.      Quit Smoking   Not on track    Recommend to continue efforts to reduce smoking habits until no longer smoking (Smoking Cessation literature attached to AVS).         Depression Screen    03/28/2022    8:20 AM 12/08/2021    9:32 AM 06/27/2021    1:50 PM 05/13/2020   10:41 AM 03/22/2020    9:56 AM 03/19/2019    9:24 AM 03/18/2018   10:06 AM  PHQ 2/9 Scores  PHQ - 2 Score 0 3 2 0 0 0 0  PHQ- 9 Score  3 3 0       Fall Risk    03/28/2022    8:17 AM 02/07/2022    2:01 PM 12/08/2021    9:32 AM 06/27/2021    1:49 PM 05/13/2020   10:41 AM  Fall Risk   Falls in the past year? 0 0  0 0  Number falls in past yr: 0  0 0   Injury with Fall? 0  0 0   Risk for fall due to : No Fall Risks No Fall Risks  Mental status change   Follow up Education provided;Falls prevention discussed Falls evaluation completed   Falls evaluation completed    FALL RISK PREVENTION PERTAINING TO THE HOME:  Any stairs in or  around the home? Yes 2 steps outside If so, are there any without handrails? No  Home free of loose throw rugs in walkways, pet beds, electrical cords, etc? Yes  Adequate lighting in your home to reduce risk of falls? Yes   ASSISTIVE DEVICES UTILIZED TO PREVENT FALLS:  Life alert? No  Use of a cane, walker or w/c? No  Grab bars in the bathroom? No  Shower chair or bench in shower? Yes  Elevated toilet seat or a handicapped toilet? Yes   TIMED UP AND GO:  Was the test performed? Yes .  Length of time to ambulate 10 feet: 10 sec.   Gait slow and steady without use of assistive device  Cognitive Function:    08/08/2021    9:32 AM 03/24/2021   10:17 AM  MMSE - Mini Mental State Exam  Orientation to time 0 1  Orientation to Place 1 3  Registration 1 3  Attention/ Calculation 2 4  Recall 0 0  Language- name 2 objects 2 2  Language- repeat 1 1  Language- follow 3 step command 1 1  Language- read & follow direction 0 1  Write a sentence 1 1  Copy design 0 0  Total score 9 17        03/28/2022    8:33 AM  6CIT Screen  What Year? 0 points  What month? 0 points  What time? 3 points  Count back from 20 2 points    Immunizations Immunization History  Administered Date(s) Administered   COVID-19, mRNA, vaccine(Comirnaty)12 years and older 12/08/2021   Fluad Quad(high Dose 65+) 10/15/2019, 12/08/2021   Influenza, High Dose Seasonal PF 10/02/2016, 11/05/2017   Influenza-Unspecified 11/08/2020   PFIZER(Purple  Top)SARS-COV-2 Vaccination 03/29/2019, 04/19/2019, 01/20/2020   Pneumococcal Conjugate-13 10/02/2016   Pneumococcal Polysaccharide-23 11/05/2017    TDAP status: Due, Education has been provided regarding the importance of this vaccine. Advised may receive this vaccine at local pharmacy or Health Dept. Aware to provide a copy of the vaccination record if obtained from local pharmacy or Health Dept. Verbalized acceptance and understanding.  Flu Vaccine status: Up to  date  Pneumococcal vaccine status: Up to date  Covid-19 vaccine status: Completed vaccines  Qualifies for Shingles Vaccine? Yes   Zostavax completed No   Shingrix Completed?: No.    Education has been provided regarding the importance of this vaccine. Patient has been advised to call insurance company to determine out of pocket expense if they have not yet received this vaccine. Advised may also receive vaccine at local pharmacy or Health Dept. Verbalized acceptance and understanding.  Screening Tests Health Maintenance  Topic Date Due   DTaP/Tdap/Td (1 - Tdap) Never done   Zoster Vaccines- Shingrix (1 of 2) Never done   Lung Cancer Screening  12/09/2022 (Originally 01/25/1993)   DEXA SCAN  12/09/2022 (Originally 03/01/2016)   Medicare Annual Wellness (AWV)  03/28/2023   Pneumonia Vaccine 53+ Years old  Completed   INFLUENZA VACCINE  Completed   COVID-19 Vaccine  Completed   Hepatitis C Screening  Completed   HPV VACCINES  Aged Out    Health Maintenance  Health Maintenance Due  Topic Date Due   DTaP/Tdap/Td (1 - Tdap) Never done   Zoster Vaccines- Shingrix (1 of 2) Never done    Colorectal cancer screening: No longer required.   Mammogram status: No longer required due to age.  Lung Cancer Screening: (Low Dose CT Chest recommended if Age 15-80 years, 30 pack-year currently smoking OR have quit w/in 15years.) does qualify.   Lung Cancer Screening Referral: no pt declines  Additional Screening:  Hepatitis C Screening: does not qualify; Completed yes  Vision Screening: Recommended annual ophthalmology exams for early detection of glaucoma and other disorders of the eye. Is the patient up to date with their annual eye exam?  Yes  Who is the provider or what is the name of the office in which the patient attends annual eye exams? Dr Matilde Sprang If pt is not established with a provider, would they like to be referred to a provider to establish care? No .   Dental Screening:  Recommended annual dental exams for proper oral hygiene  Community Resource Referral / Chronic Care Management: CRR required this visit?  No   CCM required this visit?  No      Plan:     I have personally reviewed and noted the following in the patient's chart:   Medical and social history Use of alcohol, tobacco or illicit drugs  Current medications and supplements including opioid prescriptions. Patient is not currently taking opioid prescriptions. Functional ability and status Nutritional status Physical activity Advanced directives List of other physicians Hospitalizations, surgeries, and ER visits in previous 12 months Vitals Screenings to include cognitive, depression, and falls Referrals and appointments  In addition, I have reviewed and discussed with patient certain preventive protocols, quality metrics, and best practice recommendations. A written personalized care plan for preventive services as well as general preventive health recommendations were provided to patient.     Roger Shelter, LPN   [id number]   Nurse Notes: pt comes in to visit with partner for visit. Partner Social worker) who is her caretaker relays pt "dementia" is getting  worse. Pt expresses concerns of bad cramps (she repeated 4 times while at visit) in her legs that come on suddenly at times. She also complains of intermittent left leg pain. Pt relays she drinks"lots" of water as she was told. *pt/caretaker wants feedback [id number] in her legs.

## 2022-03-28 NOTE — Patient Instructions (Signed)
[name] , Thank you for taking time to come for your Medicare Wellness Visit. I appreciate your ongoing commitment to your health goals. Please review the following plan we discussed and let me know if I can assist you in the future.   These are the goals we discussed:  Goals      DIET - INCREASE WATER INTAKE     Recommend to drink at least 6-8 8oz glasses of water per day.     Exercise 3x per week (30 min per time)     Recommend to start exercising 3 days a week for at least minutes a time. Pt to start exercising when the weather warms up.      Quit Smoking     Recommend to continue efforts to reduce smoking habits until no longer smoking (Smoking Cessation literature attached to AVS).          This is a list of the screening recommended for you and due dates:  Health Maintenance  Topic Date Due   DTaP/Tdap/Td vaccine (1 - Tdap) Never done   Zoster (Shingles) Vaccine (1 of 2) Never done   Screening for Lung Cancer  12/09/2022*   DEXA scan (bone density measurement)  12/09/2022*   Medicare Annual Wellness Visit  03/28/2023   Pneumonia Vaccine  Completed   Flu Shot  Completed   COVID-19 Vaccine  Completed   Hepatitis C Screening: USPSTF Recommendation to screen - Ages 18-79 yo.  Completed   HPV Vaccine  Aged Out  *Topic was postponed. The date shown is not the original due date.    Advanced directives: no given paperwork to complete to partner  Conditions/risks identified: falls risk  Next appointment: Follow up in one year for your annual wellness visit 04/01/2023 @8 :45am in person   Preventive Care 65 Years and Older, Female Preventive care refers to lifestyle choices and visits with your health care provider that can promote health and wellness. What does preventive care include? A yearly physical exam. This is also called an annual well check. Dental exams once or twice a year. Routine eye exams. Ask your health care provider how often you should have your eyes  checked. Personal lifestyle choices, including: Daily care of your teeth and gums. Regular physical activity. Eating a healthy diet. Avoiding tobacco and drug use. Limiting alcohol use. Practicing safe sex. Taking low-dose aspirin every day. Taking vitamin and mineral supplements as recommended by your health care provider. What happens during an annual well check? The services and screenings done by your health care provider during your annual well check will depend on your age, overall health, lifestyle risk factors, and family history of disease. Counseling  Your health care provider may ask you questions about your: Alcohol use. Tobacco use. Drug use. Emotional well-being. Home and relationship well-being. Sexual activity. Eating habits. History of falls. Memory and ability to understand (cognition). Work and work Statistician. Reproductive health. Screening  You may have the following tests or measurements: Height, weight, and BMI. Blood pressure. Lipid and cholesterol levels. These may be checked every 5 years, or more frequently if you are over 6 years old. Skin check. Lung cancer screening. You may have this screening every year starting at age 49 if you have a 30-pack-year history of smoking and currently smoke or have quit within the past 15 years. Fecal occult blood test (FOBT) of the stool. You may have this test every year starting at age 73. Flexible sigmoidoscopy or colonoscopy. You may have  a sigmoidoscopy every 5 years or a colonoscopy every 10 years starting at age 3. Hepatitis C blood test. Hepatitis B blood test. Sexually transmitted disease (STD) testing. Diabetes screening. This is done by checking your blood sugar (glucose) after you have not eaten for a while (fasting). You may have this done every 1-3 years. Bone density scan. This is done to screen for osteoporosis. You may have this done starting at age 60. Mammogram. This may be done every 1-2  years. Talk to your health care provider about how often you should have regular mammograms. Talk with your health care provider about your test results, treatment options, and if necessary, the need for more tests. Vaccines  Your health care provider may recommend certain vaccines, such as: Influenza vaccine. This is recommended every year. Tetanus, diphtheria, and acellular pertussis (Tdap, Td) vaccine. You may need a Td booster every 10 years. Zoster vaccine. You may need this after age 31. Pneumococcal 13-valent conjugate (PCV13) vaccine. One dose is recommended after age 74. Pneumococcal polysaccharide (PPSV23) vaccine. One dose is recommended after age 4. Talk to your health care provider about which screenings and vaccines you need and how often you need them. This information is not intended to replace advice given to you by your health care provider. Make sure you discuss any questions you have with your health care provider. Document Released: 01/21/2015 Document Revised: 09/14/2015 Document Reviewed: 10/26/2014 Elsevier Interactive Patient Education  2017 [place] Prevention in the Home Falls can cause injuries. They can happen to people of all ages. There are many things you can do to make your home safe and to help prevent falls. What can I do on the outside of my home? Regularly fix the edges of walkways and driveways and fix any cracks. Remove anything that might make you trip as you walk through a door, such as a raised step or threshold. Trim any bushes or trees on the path to your home. Use bright outdoor lighting. Clear any walking paths of anything that might make someone trip, such as rocks or tools. Regularly check to see if handrails are loose or broken. Make sure that both sides of any steps have handrails. Any raised decks and porches should have guardrails on the edges. Have any leaves, snow, or ice cleared regularly. Use sand or salt on walking paths  during winter. Clean up any spills in your garage right away. This includes oil or grease spills. What can I do in the bathroom? Use night lights. Install grab bars by the toilet and in the tub and shower. Do not use towel bars as grab bars. Use non-skid mats or decals in the tub or shower. If you need to sit down in the shower, use a plastic, non-slip stool. Keep the floor dry. Clean up any water that spills on the floor as soon as it happens. Remove soap buildup in the tub or shower regularly. Attach bath mats securely with double-sided non-slip rug tape. Do not have throw rugs and other things on the floor that can make you trip. What can I do in the bedroom? Use night lights. Make sure that you have a light by your bed that is easy to reach. Do not use any sheets or blankets that are too big for your bed. They should not hang down onto the floor. Have a firm chair that has side arms. You can use this for support while you get dressed. Do not have throw rugs and  other things on the floor that can make you trip. What can I do in the kitchen? Clean up any spills right away. Avoid walking on wet floors. Keep items that you use a lot in easy-to-reach places. If you need to reach something above you, use a strong step stool that has a grab bar. Keep electrical cords out of the way. Do not use floor polish or wax that makes floors slippery. If you must use wax, use non-skid floor wax. Do not have throw rugs and other things on the floor that can make you trip. What can I do with my stairs? Do not leave any items on the stairs. Make sure that there are handrails on both sides of the stairs and use them. Fix handrails that are broken or loose. Make sure that handrails are as long as the stairways. Check any carpeting to make sure that it is firmly attached to the stairs. Fix any carpet that is loose or worn. Avoid having throw rugs at the top or bottom of the stairs. If you do have throw  rugs, attach them to the floor with carpet tape. Make sure that you have a light switch at the top of the stairs and the bottom of the stairs. If you do not have them, ask someone to add them for you. What else can I do to help prevent falls? Wear shoes that: Do not have high heels. Have rubber bottoms. Are comfortable and fit you well. Are closed at the toe. Do not wear sandals. If you use a stepladder: Make sure that it is fully opened. Do not climb a closed stepladder. Make sure that both sides of the stepladder are locked into place. Ask someone to hold it for you, if possible. Clearly mark and make sure that you can see: Any grab bars or handrails. First and last steps. Where the edge of each step is. Use tools that help you move around (mobility aids) if they are needed. These include: Canes. Walkers. Scooters. Crutches. Turn on the lights when you go into a dark area. Replace any light bulbs as soon as they burn out. Set up your furniture so you have a clear path. Avoid moving your furniture around. If any of your floors are uneven, fix them. If there are any pets around you, be aware of where they are. Review your medicines with your doctor. Some medicines can make you feel dizzy. This can increase your chance of falling. Ask your doctor what other things that you can do to help prevent falls. This information is not intended to replace advice given to you by your health care provider. Make sure you discuss any questions you have with your health care provider. Document Released: 10/21/2008 Document Revised: 06/02/2015 Document Reviewed: 01/29/2014 Elsevier Interactive Patient Education  2017 [employer].

## 2022-03-29 ENCOUNTER — Ambulatory Visit (INDEPENDENT_AMBULATORY_CARE_PROVIDER_SITE_OTHER): Payer: 59 | Admitting: Physician Assistant

## 2022-03-29 ENCOUNTER — Encounter: Payer: Self-pay | Admitting: Physician Assistant

## 2022-03-29 VITALS — BP 137/64 | HR 72 | Wt 144.7 lb

## 2022-03-29 DIAGNOSIS — G2581 Restless legs syndrome: Secondary | ICD-10-CM

## 2022-03-29 DIAGNOSIS — F03B0 Unspecified dementia, moderate, without behavioral disturbance, psychotic disturbance, mood disturbance, and anxiety: Secondary | ICD-10-CM

## 2022-03-29 DIAGNOSIS — M62838 Other muscle spasm: Secondary | ICD-10-CM

## 2022-03-29 MED ORDER — ROPINIROLE HCL 0.25 MG PO TABS: 0.2500 mg | ORAL_TABLET | Freq: Two times a day (BID) | ORAL | 1 refills | Status: DC

## 2022-03-29 NOTE — Assessment & Plan Note (Signed)
Pt takes her ropinirole in the AM and has been very resistant to switching at night Advised she take it twice a day-- AM and PM

## 2022-03-29 NOTE — Progress Notes (Signed)
I,Sha'taria Tyson,acting as a Education administrator for [employer], PA-C.,have documented all relevant documentation on the behalf of [name], PA-C,as directed by  [name], PA-C while in the presence of [name], PA-C.   Established patient visit   Patient: Katie Carter   DOB: August 20, 1945   77 y.o. Female  MRN: [id number] Visit Date: 03/29/2022  Today's healthcare provider: [name], PA-C   Cc. Leg cramps  Subjective    HPI   Pt reports sudden onset cramping in b/l legs, one at a time for the last few weeks. It will happen at night, when she is walking, or sitting. Reports it feels painful and tight and resolves after a few minutes. She is accompanied by her partner today who attests to frequent leg movements at night. He admits to worsening dementia in [place] but still states he is comfortable caring for her himself.  Medications: Outpatient Medications Prior to Visit  Medication Sig   Fluticasone-Umeclidin-Vilant (TRELEGY ELLIPTA) 100-62.5-25 MCG/ACT AEPB Inhale 1 puff into the lungs daily.   pantoprazole (PROTONIX) 40 MG tablet Take 1 tablet (40 mg total) by mouth daily. Take in the morning before breakfast   [DISCONTINUED] rOPINIRole (REQUIP) 0.25 MG tablet TAKE 1 TABLET(0.25 MG) BY MOUTH AT BEDTIME   ondansetron (ZOFRAN) 4 MG tablet Take 1 tablet (4 mg total) by mouth every 8 (eight) hours as needed for nausea or vomiting. (Patient not taking: Reported on 03/28/2022)   No facility-administered medications prior to visit.    Review of Systems  Constitutional:  Negative for fatigue and fever.  Respiratory:  Negative for cough and shortness of breath.   Cardiovascular:  Negative for chest pain and leg swelling.  Gastrointestinal:  Negative for abdominal pain.  Musculoskeletal:  Positive for myalgias.  Neurological:  Negative for dizziness and headaches.       Objective    BP 137/64 (BP Location: Right Arm, Patient Position: Sitting, Cuff Size: Normal)    Pulse 72   Wt 144 lb 11.2 oz (65.6 kg)   SpO2 98%   BMI 24.84 kg/m    Physical Exam Constitutional:      General: She is awake.     Appearance: She is well-developed.  HENT:     Head: Normocephalic.  Eyes:     Conjunctiva/sclera: Conjunctivae normal.  Cardiovascular:     Rate and Rhythm: Normal rate and regular rhythm.     Heart sounds: Normal heart sounds.  Pulmonary:     Effort: Pulmonary effort is normal.     Breath sounds: Normal breath sounds.  Musculoskeletal:     Right lower leg: No edema.     Left lower leg: No edema.  Skin:    General: Skin is warm.  Neurological:     Mental Status: She is alert and oriented to person, place, and time.  Psychiatric:        Attention and Perception: Attention normal.        Mood and Affect: Mood normal.        Speech: Speech normal.        Behavior: Behavior is cooperative.      No results found for any visits on 03/29/22.  Assessment & Plan     Leg cramps Will check cmp, mag, vit b12, ck  Problem List Items Addressed This Visit       Nervous and Auditory   Moderate dementia without behavioral disturbance, psychotic disturbance, mood disturbance, or anxiety ([place])    Again spoke  to caregiver/partner today about social work referral, resources for help at home, neuro referral. Pt and caregiver decline all      Relevant Medications   rOPINIRole (REQUIP) 0.25 MG tablet     Other   Restless leg    Pt takes her ropinirole in the AM and has been very resistant to switching at night Advised she take it twice a day-- AM and PM       Relevant Medications   rOPINIRole (REQUIP) 0.25 MG tablet   Other Visit Diagnoses     Muscle spasms of both lower extremities    -  Primary   Relevant Orders   Comprehensive Metabolic Panel (CMET)   CK (Creatine Kinase)   Magnesium   Vitamin B12        Return if symptoms worsen or fail to improve.     I, [name], PA-C have reviewed all documentation for this visit. The  documentation on 03/29/22  for the exam, diagnosis, procedures, and orders are all accurate and complete.  [name], PA-C [facility] [address] [place], [place], [address] Office: [phone] Fax: [place]

## 2022-03-29 NOTE — Assessment & Plan Note (Signed)
Again spoke to caregiver/partner today about social work referral, resources for help at home, neuro referral. Pt and caregiver decline all

## 2022-03-30 LAB — COMPREHENSIVE METABOLIC PANEL
ALT: 10 IU/L (ref 0–32)
AST: 13 IU/L (ref 0–40)
Albumin/Globulin Ratio: 1.6 (ref 1.2–2.2)
Albumin: 4.3 g/dL (ref 3.8–4.8)
Alkaline Phosphatase: 83 IU/L (ref 44–121)
BUN/Creatinine Ratio: 14 (ref 12–28)
BUN: 15 mg/dL (ref 8–27)
Bilirubin Total: 0.4 mg/dL (ref 0.0–1.2)
CO2: 22 mmol/L (ref 20–29)
Calcium: 9.2 mg/dL (ref 8.7–10.3)
Chloride: 103 mmol/L (ref 96–106)
Creatinine, Ser: 1.08 mg/dL — ABNORMAL HIGH (ref 0.57–1.00)
Globulin, Total: 2.7 g/dL (ref 1.5–4.5)
Glucose: 101 mg/dL — ABNORMAL HIGH (ref 70–99)
Potassium: 4.3 mmol/L (ref 3.5–5.2)
Sodium: 138 mmol/L (ref 134–144)
Total Protein: 7 g/dL (ref 6.0–8.5)
eGFR: 52 mL/min/{1.73_m2} — ABNORMAL LOW (ref >59)

## 2022-03-30 LAB — VITAMIN B12: Vitamin B-12: 669 pg/mL (ref 232–1245)

## 2022-03-30 LAB — MAGNESIUM: Magnesium: 2 mg/dL (ref 1.6–2.3)

## 2022-03-30 LAB — CK: Total CK: 95 U/L (ref 32–182)

## 2022-04-21 LAB — TSH: TSH: 5.65 u[IU]/mL — ABNORMAL HIGH (ref 0.450–4.500)

## 2022-04-21 LAB — SPECIMEN STATUS REPORT

## 2022-04-25 DIAGNOSIS — H35342 Macular cyst, hole, or pseudohole, left eye: Secondary | ICD-10-CM | POA: Diagnosis not present

## 2022-04-25 DIAGNOSIS — H35371 Puckering of macula, right eye: Secondary | ICD-10-CM | POA: Diagnosis not present

## 2022-04-25 DIAGNOSIS — H2512 Age-related nuclear cataract, left eye: Secondary | ICD-10-CM | POA: Diagnosis not present

## 2022-04-25 DIAGNOSIS — H353131 Nonexudative age-related macular degeneration, bilateral, early dry stage: Secondary | ICD-10-CM | POA: Diagnosis not present

## 2022-05-16 DIAGNOSIS — H2512 Age-related nuclear cataract, left eye: Secondary | ICD-10-CM | POA: Diagnosis not present

## 2022-06-11 ENCOUNTER — Encounter: Payer: Self-pay | Admitting: Physician Assistant

## 2022-06-11 ENCOUNTER — Ambulatory Visit (INDEPENDENT_AMBULATORY_CARE_PROVIDER_SITE_OTHER): Payer: 59 | Admitting: Physician Assistant

## 2022-06-11 VITALS — BP 141/76 | HR 71 | Temp 98.1°F | Resp 14 | Ht 64.0 in | Wt 145.4 lb

## 2022-06-11 DIAGNOSIS — R030 Elevated blood-pressure reading, without diagnosis of hypertension: Secondary | ICD-10-CM | POA: Diagnosis not present

## 2022-06-11 DIAGNOSIS — F03B0 Unspecified dementia, moderate, without behavioral disturbance, psychotic disturbance, mood disturbance, and anxiety: Secondary | ICD-10-CM

## 2022-06-11 DIAGNOSIS — J449 Chronic obstructive pulmonary disease, unspecified: Secondary | ICD-10-CM

## 2022-06-11 DIAGNOSIS — R112 Nausea with vomiting, unspecified: Secondary | ICD-10-CM | POA: Diagnosis not present

## 2022-06-11 DIAGNOSIS — N1831 Chronic kidney disease, stage 3a: Secondary | ICD-10-CM

## 2022-06-11 DIAGNOSIS — G2581 Restless legs syndrome: Secondary | ICD-10-CM | POA: Diagnosis not present

## 2022-06-11 DIAGNOSIS — Z720 Tobacco use: Secondary | ICD-10-CM | POA: Diagnosis not present

## 2022-06-11 DIAGNOSIS — E039 Hypothyroidism, unspecified: Secondary | ICD-10-CM

## 2022-06-11 DIAGNOSIS — E7800 Pure hypercholesterolemia, unspecified: Secondary | ICD-10-CM

## 2022-06-11 HISTORY — DX: Elevated blood-pressure reading, without diagnosis of hypertension: R03.0

## 2022-06-11 HISTORY — DX: Nausea with vomiting, unspecified: R11.2

## 2022-06-11 MED ORDER — PANTOPRAZOLE SODIUM 40 MG PO TBEC: 40.0000 mg | DELAYED_RELEASE_TABLET | Freq: Every day | ORAL | 3 refills | Status: DC

## 2022-06-11 NOTE — Assessment & Plan Note (Signed)
Stable

## 2022-06-11 NOTE — Assessment & Plan Note (Signed)
Pt is compliant with trelegy. Declines lung cancer screening or pulm referral.  Notable difference w/ trelegy use

## 2022-06-11 NOTE — Assessment & Plan Note (Signed)
2/2 gerd. Controlled with pantoprazole

## 2022-06-11 NOTE — Assessment & Plan Note (Signed)
Pt declines smoking cessation

## 2022-06-11 NOTE — Assessment & Plan Note (Signed)
Plan to monitor periodically, but pt has refused addition of any medications

## 2022-06-11 NOTE — Assessment & Plan Note (Signed)
Will not continue to check at this point -- d/t dementia pt is unable to take any additional meds; she refuses. Caregiver has been unable to administer to pt either

## 2022-06-11 NOTE — Assessment & Plan Note (Signed)
Stable. She has difficulty changing the timing of her medication. Took in am for years, now BID

## 2022-06-11 NOTE — Assessment & Plan Note (Signed)
Will monitor

## 2022-06-11 NOTE — Progress Notes (Signed)
I,Vanessa  Vital,acting as a Neurosurgeon for [employer], PA-C.,have documented all relevant documentation on the behalf of Alfredia Ferguson, PA-C,as directed by  Alfredia Ferguson, PA-C while in the presence of Alfredia Ferguson, PA-C.    Established patient visit   Patient: Katie Carter   DOB: December 07, 1945   77 y.o. Female  MRN: [id number] Visit Date: 06/11/2022  Today's healthcare provider: Alfredia Ferguson, PA-C   Cc. Chronic care follow up  Subjective    HPI  Patient's family states pantoprazole refill is needed, states no other questions or concerns for today.  Memory Loss Pt and companion today with no changes. Her partner attests to continued memory changes and difficulty.   Medications: Outpatient Medications Prior to Visit  Medication Sig   Fluticasone-Umeclidin-Vilant (TRELEGY ELLIPTA) 100-62.5-25 MCG/ACT AEPB Inhale 1 puff into the lungs daily.   ondansetron (ZOFRAN) 4 MG tablet Take 1 tablet (4 mg total) by mouth every 8 (eight) hours as needed for nausea or vomiting. (Patient not taking: Reported on 03/28/2022)   pantoprazole (PROTONIX) 40 MG tablet Take 1 tablet (40 mg total) by mouth daily. Take in the morning before breakfast   rOPINIRole (REQUIP) 0.25 MG tablet Take 1 tablet (0.25 mg total) by mouth 2 (two) times daily.   No facility-administered medications prior to visit.    Review of Systems  All other systems reviewed and are negative.    Objective    There were no vitals taken for this visit.   Physical Exam Constitutional:      General: She is awake.     Appearance: She is well-developed.  HENT:     Head: Normocephalic.  Eyes:     Conjunctiva/sclera: Conjunctivae normal.  Cardiovascular:     Rate and Rhythm: Normal rate and regular rhythm.     Heart sounds: Normal heart sounds.  Pulmonary:     Effort: Pulmonary effort is normal.     Breath sounds: Normal breath sounds.  Musculoskeletal:     Right lower leg: No edema.     Left lower leg: No edema.   Skin:    General: Skin is warm.  Neurological:     Mental Status: She is alert and oriented to person, place, and time.  Psychiatric:        Attention and Perception: Attention normal.        Mood and Affect: Mood normal.        Speech: Speech normal.        Behavior: Behavior is cooperative.      No results found for any visits on 06/11/22.  Assessment & Plan     Problem List Items Addressed This Visit       Respiratory   CAFL (chronic airflow limitation) (HCC)    Pt is compliant with trelegy. Declines lung cancer screening or pulm referral.  Notable difference w/ trelegy use        Digestive   Nausea and vomiting    2/2 gerd. Controlled with pantoprazole      Relevant Medications   pantoprazole (PROTONIX) 40 MG tablet     Endocrine   Adult hypothyroidism    Plan to monitor periodically, but pt has refused addition of any medications        Nervous and Auditory   Moderate dementia without behavioral disturbance, psychotic disturbance, mood disturbance, or anxiety (HCC) - Primary    Again discussed resources for help at home, social work referral, neuro referral. Pt and caregiver decline all.  Pt  is not able to take any additional medications due to her memory changes, and caregiver is unable to successfully administer.        Genitourinary   Chronic kidney disease, stage 3a (HCC)    Stable.        Other   Hypercholesteremia    Will not continue to check at this point -- d/t dementia pt is unable to take any additional meds; she refuses. Caregiver has been unable to administer to pt either      Tobacco use    Pt declines smoking cessation      Restless leg    Stable. She has difficulty changing the timing of her medication. Took in am for years, now BID      Elevated blood pressure reading    Will monitor.         No follow-ups on file.      I, Alfredia Ferguson, PA-C have reviewed all documentation for this visit. The documentation on   06/11/22   for the exam, diagnosis, procedures, and orders are all accurate and complete.  Alfredia Ferguson, PA-C [facility] [address] [place], [place], [address] Office: [phone] Fax: [phone]   [facility]

## 2022-06-11 NOTE — Assessment & Plan Note (Addendum)
Again discussed resources for help at home, social work referral, neuro referral. Pt and caregiver decline all.  Pt is not able to take any additional medications due to her memory changes, and caregiver is unable to successfully administer.

## 2022-06-15 ENCOUNTER — Ambulatory Visit (INDEPENDENT_AMBULATORY_CARE_PROVIDER_SITE_OTHER): Payer: 59 | Admitting: Podiatry

## 2022-06-15 VITALS — BP 136/66

## 2022-06-15 DIAGNOSIS — B351 Tinea unguium: Secondary | ICD-10-CM

## 2022-06-15 DIAGNOSIS — M79674 Pain in right toe(s): Secondary | ICD-10-CM | POA: Diagnosis not present

## 2022-06-15 DIAGNOSIS — M79675 Pain in left toe(s): Secondary | ICD-10-CM | POA: Diagnosis not present

## 2022-06-15 NOTE — Progress Notes (Signed)
  Subjective:  Patient ID: Katie Carter, female    DOB: 1945-12-22,  MRN: [id number]  Katie Carter presents to clinic today for painful elongated mycotic toenails 1-5 bilaterally which are tender when wearing enclosed shoe gear. Pain is relieved with periodic professional debridement. Chief Complaint  Patient presents with   Nail Problem    RFC,PCP:   Sherlyn Hay, DO General  :,LOV:06/24    New problem(s): None.   PCP is Sherlyn Hay, DO.  Allergies  Allergen Reactions   Meloxicam Other (See Comments)    Severe chest pain   Aspirin     vomiting   Tramadol Rash    Review of Systems: Negative except as noted in the HPI. Objective:   Constitutional Katie Carter is a pleasant 77 y.o. female, WD, WN in NAD. AAO x 3.   Vascular Vascular Examination: Capillary refill time immediate b/l. Vascular status intact b/l with palpable pedal pulses. Pedal hair sparse b/l.Marland Kitchen No edema. No pain with calf compression b/l. Skin temperature gradient WNL b/l.   Neurological Examination: Sensation grossly intact b/l with 10 gram monofilament. Vibratory sensation intact b/l.   Dermatological Examination: Pedal skin with normal turgor, texture and tone b/l.  No open wounds.   Toenails 1-5 b/l thick, discolored, elongated with subungual debris and pain on dorsal palpation.   Interdigital maceration noted left foot 4th  webspace(s). No blistering, no weeping, no open wounds.  Musculoskeletal Examination: Muscle strength 5/5 to all lower extremity muscle groups bilaterally. Hammertoe(s) noted to the b/l feet.  Radiographs: None   Assessment:   1. Pain due to onychomycosis of toenails of both feet    Plan:  Patient was evaluated and treated and all questions answered. Consent given for treatment as described below: Patient was evaluated and treated. All patient's and/or POA's questions/concerns addressed on today's visit. Toenails 1-5 debrided in length and girth without incident.  Continue soft, supportive shoe gear daily. Report any pedal injuries to medical professional. Call office if there are any questions/concerns. -Patient/POA to call should there be question/concern in the interim.  Return in about 3 months (around 09/15/2022).  Freddie Breech, DPM

## 2022-06-22 ENCOUNTER — Encounter: Payer: Self-pay | Admitting: Podiatry

## 2022-07-02 ENCOUNTER — Encounter: Payer: Self-pay | Admitting: Ophthalmology

## 2022-07-02 NOTE — Anesthesia Preprocedure Evaluation (Addendum)
Anesthesia Evaluation  Patient identified by MRN, date of birth, ID band Patient awake and Patient confused    Reviewed: Allergy & Precautions, H&P , NPO status , Patient's Chart, lab work & pertinent test results  History of Anesthesia Complications (+) history of anesthetic complications  Airway Mallampati: III  TM Distance: >3 FB Neck ROM: Full    Dental  (+) Poor Dentition, Missing Multiple missing teeth, none loose:   Pulmonary shortness of breath, COPD, Current Smoker and Patient abstained from smoking.   Pulmonary exam normal breath sounds clear to auscultation       Cardiovascular negative cardio ROS Normal cardiovascular exam Rhythm:Regular Rate:Normal     Neuro/Psych  PSYCHIATRIC DISORDERS     Dementia negative neurological ROS  negative psych ROS   GI/Hepatic Neg liver ROS,GERD  ,,  Endo/Other  Hypothyroidism    Renal/GU Renal disease  negative genitourinary   Musculoskeletal  (+) Arthritis ,    Abdominal   Peds negative pediatric ROS (+)  Hematology negative hematology ROS (+)   Anesthesia Other Findings Slow to  awaken from anesthsia Tobacco use disorder (Declines cessation) Lumbago   Note from PA CAFL (chronic airflow limitation) (HCC) - Treating empirically as COPD given smoking history. Pt has repeatedly declined pfts and pulm referral.  Thyroid disease Hypercholesteremia Dyspnea Arthritis Nausea and vomiting Wears dentures but left them home today   Caregiver states patient has dementia and did not have anything to eat or drink today   Reproductive/Obstetrics negative OB ROS                             Anesthesia Physical Anesthesia Plan  ASA: 3  Anesthesia Plan: MAC   Post-op Pain Management:    Induction: Intravenous  PONV Risk Score and Plan:   Airway Management Planned: Natural Airway and Nasal Cannula  Additional Equipment:   Intra-op Plan:    Post-operative Plan:   Informed Consent: I have reviewed the patients History and Physical, chart, labs and discussed the procedure including the risks, benefits and alternatives for the proposed anesthesia with the patient or authorized representative who has indicated his/her understanding and acceptance.     Dental Advisory Given  Plan Discussed with: Anesthesiologist, CRNA and Surgeon  Anesthesia Plan Comments: (Patient consented for risks of anesthesia including but not limited to:  - adverse reactions to medications - damage to eyes, teeth, lips or other oral mucosa - nerve damage due to positioning  - sore throat or hoarseness - Damage to heart, brain, nerves, lungs, other parts of body or loss of life  Patient voiced understanding.)       Anesthesia Quick Evaluation

## 2022-07-03 NOTE — Discharge Instructions (Signed)

## 2022-07-05 ENCOUNTER — Ambulatory Visit
Admission: RE | Admit: 2022-07-05 | Discharge: 2022-07-05 | Disposition: A | Discharge status: Home / Self Care | Payer: 59 | Attending: Ophthalmology | Admitting: Ophthalmology

## 2022-07-05 ENCOUNTER — Ambulatory Visit: Payer: 59 | Admitting: Anesthesiology

## 2022-07-05 ENCOUNTER — Encounter
Admission: RE | Disposition: A | Discharge status: Home / Self Care | Payer: Self-pay | Source: Home / Self Care | Attending: Ophthalmology

## 2022-07-05 ENCOUNTER — Other Ambulatory Visit: Payer: Self-pay

## 2022-07-05 ENCOUNTER — Encounter: Payer: Self-pay | Admitting: Ophthalmology

## 2022-07-05 DIAGNOSIS — H269 Unspecified cataract: Secondary | ICD-10-CM | POA: Diagnosis not present

## 2022-07-05 DIAGNOSIS — K219 Gastro-esophageal reflux disease without esophagitis: Secondary | ICD-10-CM | POA: Diagnosis not present

## 2022-07-05 DIAGNOSIS — J449 Chronic obstructive pulmonary disease, unspecified: Secondary | ICD-10-CM | POA: Insufficient documentation

## 2022-07-05 DIAGNOSIS — F03B0 Unspecified dementia, moderate, without behavioral disturbance, psychotic disturbance, mood disturbance, and anxiety: Secondary | ICD-10-CM | POA: Insufficient documentation

## 2022-07-05 DIAGNOSIS — F17210 Nicotine dependence, cigarettes, uncomplicated: Secondary | ICD-10-CM | POA: Insufficient documentation

## 2022-07-05 DIAGNOSIS — H2512 Age-related nuclear cataract, left eye: Secondary | ICD-10-CM | POA: Insufficient documentation

## 2022-07-05 HISTORY — PX: CATARACT EXTRACTION W/PHACO: SHX586

## 2022-07-05 HISTORY — DX: Presence of dental prosthetic device (complete) (partial): Z97.2

## 2022-07-05 HISTORY — DX: Unspecified dementia, moderate, without behavioral disturbance, psychotic disturbance, mood disturbance, and anxiety: F03.B0

## 2022-07-05 HISTORY — DX: Chronic kidney disease, stage 3a: N18.31

## 2022-07-05 HISTORY — DX: Other complications of anesthesia, initial encounter: T88.59XA

## 2022-07-05 HISTORY — DX: Restless legs syndrome: G25.81

## 2022-07-05 SURGERY — PHACOEMULSIFICATION, CATARACT, WITH IOL INSERTION
Anesthesia: Monitor Anesthesia Care | Laterality: Left

## 2022-07-05 MED ORDER — SIGHTPATH DOSE#1 BSS IO SOLN
INTRAOCULAR | Status: DC | PRN
  Administered 2022-07-05: 15 mL via INTRAOCULAR

## 2022-07-05 MED ORDER — TETRACAINE HCL 0.5 % OP SOLN
1.0000 [drp] | OPHTHALMIC | Status: DC | PRN
  Administered 2022-07-05 (×3): 1 [drp] via OPHTHALMIC

## 2022-07-05 MED ORDER — BRIMONIDINE TARTRATE-TIMOLOL 0.2-0.5 % OP SOLN
OPHTHALMIC | Status: DC | PRN
  Administered 2022-07-05: 1 [drp] via OPHTHALMIC

## 2022-07-05 MED ORDER — LACTATED RINGERS IV SOLN: INTRAVENOUS | Status: DC

## 2022-07-05 MED ORDER — ARMC OPHTHALMIC DILATING DROPS
1.0000 | OPHTHALMIC | Status: DC | PRN
  Administered 2022-07-05 (×3): 1 via OPHTHALMIC

## 2022-07-05 MED ORDER — LIDOCAINE HCL (PF) 2 % IJ SOLN
INTRAOCULAR | Status: DC | PRN
  Administered 2022-07-05: 4 mL via INTRAOCULAR

## 2022-07-05 MED ORDER — FENTANYL CITRATE (PF) 100 MCG/2ML IJ SOLN
INTRAMUSCULAR | Status: DC | PRN
  Administered 2022-07-05 (×2): 25 ug via INTRAVENOUS
  Administered 2022-07-05: 50 ug via INTRAVENOUS

## 2022-07-05 MED ORDER — SIGHTPATH DOSE#1 BSS IO SOLN
INTRAOCULAR | Status: DC | PRN
  Administered 2022-07-05: 71 mL via OPHTHALMIC

## 2022-07-05 MED ORDER — TETRACAINE 0.5 % OP SOLN OPTIME - NO CHARGE
OPHTHALMIC | Status: DC | PRN
  Administered 2022-07-05: 2 [drp] via OPHTHALMIC

## 2022-07-05 MED ORDER — MOXIFLOXACIN HCL 0.5 % OP SOLN
OPHTHALMIC | Status: DC | PRN
  Administered 2022-07-05: .2 mL via OPHTHALMIC

## 2022-07-05 MED ORDER — SIGHTPATH DOSE#1 NA HYALUR & NA CHOND-NA HYALUR IO KIT
PACK | INTRAOCULAR | Status: DC | PRN
  Administered 2022-07-05: 1 via OPHTHALMIC

## 2022-07-05 SURGICAL SUPPLY — 9 items
CATARACT SUITE SIGHTPATH (MISCELLANEOUS) ×1 IMPLANT
DISSECTOR HYDRO NUCLEUS 50X22 (MISCELLANEOUS) ×1 IMPLANT
DRSG TEGADERM 2-3/8X2-3/4 SM (GAUZE/BANDAGES/DRESSINGS) ×1 IMPLANT
FEE CATARACT SUITE SIGHTPATH (MISCELLANEOUS) ×1 IMPLANT
GLOVE SURG SYN 7.5 E (GLOVE) ×1 IMPLANT
GLOVE SURG SYN 7.5 PF PI (GLOVE) ×1 IMPLANT
GLOVE SURG SYN 8.5 E (GLOVE) ×1 IMPLANT
GLOVE SURG SYN 8.5 PF PI (GLOVE) ×1 IMPLANT
LENS IOL TECNIS EYHANCE 23.0 (Intraocular Lens) IMPLANT

## 2022-07-05 NOTE — Op Note (Signed)
OPERATIVE NOTE  Katie Carter [id number] 07/05/2022   PREOPERATIVE DIAGNOSIS: Nuclear sclerotic cataract left eye. H25.12   POSTOPERATIVE DIAGNOSIS: Nuclear sclerotic cataract left eye. H25.12   PROCEDURE:  Phacoemusification with posterior chamber intraocular lens placement of the left eye  Ultrasound time: Procedure(s): CATARACT EXTRACTION PHACO AND INTRAOCULAR LENS PLACEMENT (IOC) LEFT 16.41 01:15.4 (Left)  LENS:   Implant Name Type Inv. Item Serial No. Manufacturer Lot No. LRB No. Used Action  LENS IOL TECNIS EYHANCE 23.0 - [id number] Intraocular Lens LENS IOL TECNIS EYHANCE 23.0 [id number] [employer]  Left 1 Implanted      SURGEON:  Julious Payer. Rolley Sims, MD   ANESTHESIA:  Topical with tetracaine drops, augmented with 1% preservative-free intracameral lidocaine.   COMPLICATIONS:  None.   DESCRIPTION OF PROCEDURE:  The patient was identified in the holding room and transported to the operating room and placed in the supine position under the operating microscope.  The left eye was identified as the operative eye, which was prepped and draped in the usual sterile ophthalmic fashion.   A 1 millimeter clear-corneal paracentesis was made inferotemporally. Preservative-free 1% lidocaine mixed with 1:1,000 bisulfite-free aqueous solution of epinephrine was injected into the anterior chamber. The anterior chamber was then filled with Viscoat viscoelastic. A 2.4 millimeter keratome was used to make a clear-corneal incision superotemporally. A curvilinear capsulorrhexis was made with a cystotome and capsulorrhexis forceps. Balanced salt solution was used to hydrodissect and hydrodelineate the nucleus. Phacoemulsification was then used to remove the lens nucleus and epinucleus. The remaining cortex was then removed using the irrigation and aspiration handpiece. Provisc was then placed into the capsular bag to distend it for lens placement. A +23.00 D DIB00 intraocular lens was then injected into the  capsular bag. The remaining viscoelastic was aspirated.   Wounds were hydrated with balanced salt solution.  The anterior chamber was inflated to a physiologic pressure with balanced salt solution.  No wound leaks were noted. Vigamox was injected intracamerally.  Timolol and Brimonidine drops were applied to the eye.  The patient was taken to the recovery room in stable condition without complications of anesthesia or surgery.  [name] 07/05/2022, 8:54 AM

## 2022-07-05 NOTE — H&P (Signed)
[facility]   Primary Care Physician:  Sherlyn Hay, DO Ophthalmologist: Dr. Deberah Pelton  Pre-Procedure History & Physical: HPI:  Katie Carter is a 77 y.o. female here for cataract surgery.   Past Medical History:  Diagnosis Date   Arthritis    Chronic airway obstruction (HCC)    Chronic kidney disease, stage 3a (HCC)    Complication of anesthesia    slow to wake   Dyspnea    Hypercholesteremia    Lumbago 05/06/2006   Moderate dementia without behavioral disturbance, psychotic disturbance, mood disturbance, or anxiety (HCC)    Nausea and vomiting 06/11/2022   Restless leg syndrome    Thyroid disease 10/15/2005   Tobacco use disorder    Wears dentures    partial upper and lower    Past Surgical History:  Procedure Laterality Date   ABDOMINAL HYSTERECTOMY  1960   abdominal Hysterectomy with bilateral salpingo-oophorectomy   APPENDECTOMY     CATARACT EXTRACTION Right 10/2012   Surgical Fixation  1981   of the left knee fracture   TOTAL KNEE ARTHROPLASTY Left 11/20/2017   Procedure: TOTAL KNEE ARTHROPLASTY hardware removal;  Surgeon: Lyndle Herrlich, MD;  Location: [facility] ORS;  Service: Orthopedics;  Laterality: Left;    Prior to Admission medications   Medication Sig Start Date End Date Taking? Authorizing Provider  Fluticasone-Umeclidin-Vilant (TRELEGY ELLIPTA) 100-62.5-25 MCG/ACT AEPB Inhale 1 puff into the lungs daily. 12/08/21  Yes Drubel, Lillia Abed, PA-C  pantoprazole (PROTONIX) 40 MG tablet Take 1 tablet (40 mg total) by mouth daily. Take in the morning before breakfast 06/11/22  Yes Drubel, Lillia Abed, PA-C  rOPINIRole (REQUIP) 0.25 MG tablet Take 1 tablet (0.25 mg total) by mouth 2 (two) times daily. 03/29/22  Yes Drubel, Lillia Abed, PA-C  ondansetron (ZOFRAN) 4 MG tablet Take 1 tablet (4 mg total) by mouth every 8 (eight) hours as needed for nausea or vomiting. Patient not taking: Reported on 03/28/2022 02/07/22   Alfredia Ferguson, PA-C    Allergies as of 04/27/2022  - Review Complete 03/29/2022  Allergen Reaction Noted   Meloxicam Other (See Comments) 10/29/2017   Aspirin  03/18/2018   Tramadol Rash 12/10/2014    Family History  Problem Relation Age of Onset   Alzheimer's disease Mother    Cancer Brother    Seizures Daughter     Social History   Socioeconomic History   Marital status: Significant Other    Spouse name: divorced   Number of children: 2   Years of education: Not on file   Highest education level: 9th grade  Occupational History   Occupation: retired  Tobacco Use   Smoking status: Every Day    Packs/day: 1.00    Years: 60.00    Additional pack years: 0.00    Total pack years: 60.00    Types: Cigarettes   Smokeless tobacco: Never   Tobacco comments:    "Has been thinking about quitting" 03/2020  started smoking as a teenager.  Vaping Use   Vaping Use: Never used  Substance and Sexual Activity   Alcohol use: Not Currently   Drug use: No   Sexual activity: Not on file  Other Topics Concern   Not on file  Social History Narrative   1 daughter living and has 1 deceased.   Social Determinants of Health   Financial Resource Strain: Low Risk  (03/28/2022)   Overall Financial Resource Strain (CARDIA)    Difficulty of Paying Living Expenses: Not hard at all  Food  Insecurity: No Food Insecurity (03/28/2022)   Hunger Vital Sign    Worried About Running Out of Food in the Last Year: Never true    Ran Out of Food in the Last Year: Never true  Transportation Needs: No Transportation Needs (03/28/2022)   PRAPARE - Administrator, Civil Service (Medical): No    Lack of Transportation (Non-Medical): No  Physical Activity: Inactive (03/28/2022)   Exercise Vital Sign    Days of Exercise per Week: 0 days    Minutes of Exercise per Session: 0 min  Stress: No Stress Concern Present (03/28/2022)   [employer] of Occupational Health - Occupational Stress Questionnaire    Feeling of Stress : Only a little  Social  Connections: Moderately Isolated (03/28/2022)   Social Connection and Isolation Panel [NHANES]    Frequency of Communication with Friends and Family: Twice a week    Frequency of Social Gatherings with Friends and Family: Once a week    Attends Religious Services: Never    Database administrator or Organizations: No    Attends Banker Meetings: Never    Marital Status: Living with partner  Intimate Partner Violence: Not At Risk (03/28/2022)   Humiliation, Afraid, Rape, and Kick questionnaire    Fear of Current or Ex-Partner: No    Emotionally Abused: No    Physically Abused: No    Sexually Abused: No    Review of Systems: See HPI, otherwise negative ROS  Physical Exam: Ht 5\' 4"  (1.626 m)   Wt 65.8 kg   BMI 24.89 kg/m  General:   Alert, cooperative in NAD Head:  Normocephalic and atraumatic. Respiratory:  Normal work of breathing. Cardiovascular:  RRR  Impression/Plan: Shirlee Latch is here for cataract surgery.  Risks, benefits, limitations, and alternatives regarding cataract surgery have been reviewed with the patient.  Questions have been answered.  All parties agreeable.   Estanislado Pandy, MD  07/05/2022, 7:09 AM

## 2022-07-05 NOTE — Anesthesia Postprocedure Evaluation (Signed)
Anesthesia Post Note  Patient: Katie Carter  Procedure(s) Performed: CATARACT EXTRACTION PHACO AND INTRAOCULAR LENS PLACEMENT (IOC) LEFT 16.41 01:15.4 (Left)  Anesthesia Type: MAC Anesthetic complications: no   No notable events documented.   Last Vitals:  Vitals:   07/05/22 0856 07/05/22 0900  BP: 125/86 137/74  Pulse:  63  Resp:  17  Temp:    SpO2:  93%    Last Pain:  Vitals:   07/05/22 0900  TempSrc:   PainSc: 0-No pain                 Cyndie Woodbeck C Windie Marasco

## 2022-07-05 NOTE — Transfer of Care (Signed)
Immediate Anesthesia Transfer of Care Note  Patient: Katie Carter  Procedure(s) Performed: CATARACT EXTRACTION PHACO AND INTRAOCULAR LENS PLACEMENT (IOC) LEFT 16.41 01:15.4 (Left)  Patient Location: PACU  Anesthesia Type: MAC  Level of Consciousness: awake, alert  and patient cooperative  Airway and Oxygen Therapy: Patient Spontanous Breathing and Patient connected to supplemental oxygen  Post-op Assessment: Post-op Vital signs reviewed, Patient's Cardiovascular Status Stable, Respiratory Function Stable, Patent Airway and No signs of Nausea or vomiting  Post-op Vital Signs: Reviewed and stable  Complications: No notable events documented.

## 2022-07-18 ENCOUNTER — Encounter: Payer: Self-pay | Admitting: Ophthalmology

## 2022-08-03 DIAGNOSIS — Z961 Presence of intraocular lens: Secondary | ICD-10-CM | POA: Diagnosis not present

## 2022-09-24 ENCOUNTER — Ambulatory Visit (INDEPENDENT_AMBULATORY_CARE_PROVIDER_SITE_OTHER): Payer: 59 | Admitting: Podiatry

## 2022-09-24 ENCOUNTER — Encounter: Payer: Self-pay | Admitting: Podiatry

## 2022-09-24 DIAGNOSIS — B353 Tinea pedis: Secondary | ICD-10-CM | POA: Diagnosis not present

## 2022-09-24 DIAGNOSIS — M79674 Pain in right toe(s): Secondary | ICD-10-CM | POA: Diagnosis not present

## 2022-09-24 DIAGNOSIS — M79675 Pain in left toe(s): Secondary | ICD-10-CM | POA: Diagnosis not present

## 2022-09-24 DIAGNOSIS — B351 Tinea unguium: Secondary | ICD-10-CM | POA: Diagnosis not present

## 2022-09-24 NOTE — Progress Notes (Signed)
Subjective:  Patient ID: Katie Carter, female    DOB: Oct 20, 1945,  MRN: [id number]  Katie Carter presents to clinic today for: painful thick toenails that are difficult to trim. Pain interferes with ambulation. Aggravating factors include wearing enclosed shoe gear. Pain is relieved with periodic professional debridement. Patient with h/o dementia has family member present who states patient did not use antifungal spray powder as she complained it was too cold, so therapy was not completed. She relates stinging of left 5th toe when foot was cleansed with alcohol.  PCP is Sherlyn Hay, DO.  Allergies  Allergen Reactions   Meloxicam Other (See Comments)    Severe chest pain   Aspirin     vomiting   Tramadol Rash    Review of Systems: Negative except as noted in the HPI.  Objective: No changes noted in today's physical examination. There were no vitals filed for this visit.  Katie Carter is a pleasant 77 y.o. female in NAD. AAO x 3.  Vascular Examination: Capillary refill time <3 seconds b/l LE. Palpable pedal pulses b/l LE. Digital hair present b/l. No pedal edema b/l. Skin temperature gradient WNL b/l. No varicosities b/l. Marland Kitchen  Dermatological Examination: Pedal skin with normal turgor, texture and tone b/l. No open wounds.  Toenails 1-5 b/l thickened, discolored, dystrophic with subungual debris. There is pain on palpation to dorsal aspect of nailplates.   Interdigital maceration noted left 4th webspace(s). No blistering, no weeping, no open wounds. Hyperkeratotic lesion(s) submet head 5 left foot.  No erythema, no edema, no drainage, no fluctuance..  Neurological Examination: Protective sensation intact with 10 gram monofilament b/l LE. Vibratory sensation intact b/l LE.   Musculoskeletal Examination: Normal muscle strength 5/5 to all lower extremity muscle groups bilaterally. Hammertoe(s) noted to the 2-5 bilaterally.. No pain, crepitus or joint limitation noted with ROM b/l  LE.  Patient ambulates independently without assistive aids.  Assessment/Plan: 1. Pain due to onychomycosis of toenails of both feet   2. Tinea pedis of left foot     -Patient's family member present. All questions/concerns addressed on today's visit. -Discontinue antifungal spray powder. Recommended OTC athlete's foot powder to be applied between toes daily. Family member related understanding and agreed with change in treatment plan. Patient notified she should not experience any cold sensation with powder application. Patient related understanding. -Will submit preauthorization to insurance company for paring of callus submet head 5 left foot. -Continue supportive shoe gear daily. -Toenails 1-5 b/l were debrided in length and girth with sterile nail nippers and dremel without iatrogenic bleeding.  -As a courtesy, callus(es) submet head 5 left foot pared utilizing sterile scalpel blade without complication or incident. Total number pared=1. -Patient/POA to call should there be question/concern in the interim.   Return in about 3 months (around 12/24/2022).  Freddie Breech, DPM

## 2022-11-22 ENCOUNTER — Other Ambulatory Visit: Payer: Self-pay | Admitting: Physician Assistant

## 2022-11-22 DIAGNOSIS — G2581 Restless legs syndrome: Secondary | ICD-10-CM

## 2022-12-11 ENCOUNTER — Ambulatory Visit: Payer: 59 | Admitting: Family Medicine

## 2022-12-11 ENCOUNTER — Encounter: Payer: Self-pay | Admitting: Family Medicine

## 2022-12-11 VITALS — BP 133/68 | HR 72 | Temp 97.9°F | Ht 64.0 in | Wt 148.0 lb

## 2022-12-11 DIAGNOSIS — R112 Nausea with vomiting, unspecified: Secondary | ICD-10-CM | POA: Diagnosis not present

## 2022-12-11 DIAGNOSIS — Z01818 Encounter for other preprocedural examination: Secondary | ICD-10-CM | POA: Diagnosis not present

## 2022-12-11 DIAGNOSIS — Z09 Encounter for follow-up examination after completed treatment for conditions other than malignant neoplasm: Secondary | ICD-10-CM | POA: Insufficient documentation

## 2022-12-11 DIAGNOSIS — Z720 Tobacco use: Secondary | ICD-10-CM

## 2022-12-11 DIAGNOSIS — E039 Hypothyroidism, unspecified: Secondary | ICD-10-CM

## 2022-12-11 DIAGNOSIS — F17200 Nicotine dependence, unspecified, uncomplicated: Secondary | ICD-10-CM | POA: Diagnosis not present

## 2022-12-11 DIAGNOSIS — K219 Gastro-esophageal reflux disease without esophagitis: Secondary | ICD-10-CM

## 2022-12-11 DIAGNOSIS — Z5320 Procedure and treatment not carried out because of patient's decision for unspecified reasons: Secondary | ICD-10-CM

## 2022-12-11 DIAGNOSIS — J449 Chronic obstructive pulmonary disease, unspecified: Secondary | ICD-10-CM | POA: Diagnosis not present

## 2022-12-11 DIAGNOSIS — N1831 Chronic kidney disease, stage 3a: Secondary | ICD-10-CM

## 2022-12-11 DIAGNOSIS — F03B0 Unspecified dementia, moderate, without behavioral disturbance, psychotic disturbance, mood disturbance, and anxiety: Secondary | ICD-10-CM

## 2022-12-11 HISTORY — DX: Encounter for follow-up examination after completed treatment for conditions other than malignant neoplasm: Z09

## 2022-12-11 MED ORDER — TRELEGY ELLIPTA 100-62.5-25 MCG/ACT IN AEPB: 1.0000 | INHALATION_SPRAY | Freq: Every day | RESPIRATORY_TRACT | 3 refills | Status: DC

## 2022-12-11 MED ORDER — PANTOPRAZOLE SODIUM 40 MG PO TBEC: 40.0000 mg | DELAYED_RELEASE_TABLET | Freq: Every day | ORAL | 1 refills | Status: DC

## 2022-12-11 NOTE — Assessment & Plan Note (Addendum)
Taking Protonix daily with good symptom control. No recent nausea, vomiting, or abdominal pain. Discussed benefits of continuing Protonix. - Continue Protonix daily

## 2022-12-11 NOTE — Progress Notes (Unsigned)
Established patient visit   Patient: Katie Carter   DOB: [birth year]   77 y.o. Female  MRN: [id number] Visit Date: 12/11/2022  Today's healthcare provider: Sherlyn Hay, DO   Chief Complaint  Patient presents with  . Clearance for dental procedure    Patient presents today stating that her dentist wanted her to be seen by her PCP prior to having dental procedure. She states she is going to have her teeth pulled and they plan to put her under general anesthesia.  Patient has history of chronic airflow limitation.      Subjective    HPI Last mAWV 03/28/2022 patient  Patient originally went to [facility] at [address], [place], [place] [address].  [phone],  - She was then referred to [facility], [address], [place], [place] for tooth extraction, for which she is seeking preop clearance.  Since  ***  {History (Optional):23778}  Medications: Outpatient Medications Prior to Visit  Medication Sig  . Fluticasone-Umeclidin-Vilant (TRELEGY ELLIPTA) 100-62.5-25 MCG/ACT AEPB Inhale 1 puff into the lungs daily.  . ondansetron (ZOFRAN) 4 MG tablet Take 1 tablet (4 mg total) by mouth every 8 (eight) hours as needed for nausea or vomiting.  . pantoprazole (PROTONIX) 40 MG tablet Take 1 tablet (40 mg total) by mouth daily. Take in the morning before breakfast  . rOPINIRole (REQUIP) 0.25 MG tablet TAKE 1 TABLET(0.25 MG) BY MOUTH TWICE DAILY   No facility-administered medications prior to visit.    Review of Systems  Constitutional:  Negative for chills, fatigue and fever.  HENT:  Negative for congestion, rhinorrhea and sinus pressure.   Eyes:  Negative for visual disturbance (just had cataract surgery).  Respiratory:  Negative for shortness of breath.   Cardiovascular:  Negative for chest pain.  Gastrointestinal:  Negative for abdominal pain, constipation, diarrhea, nausea and vomiting.  Neurological:  Negative for dizziness, light-headedness  and headaches.    {Insert previous labs (optional):23779} {See past labs  Heme  Chem  Endocrine  Serology  Results Review (optional):1}   Objective    BP 133/68 (BP Location: Left Arm, Patient Position: Sitting, Cuff Size: Normal)   Pulse 72   Temp 97.9 F (36.6 C) (Oral)   Ht 5\' 4"  (1.626 m)   Wt 148 lb (67.1 kg)   SpO2 99%   BMI 25.40 kg/m  {Insert last BP/Wt (optional):23777}{See vitals history (optional):1}   Physical Exam Constitutional:      Appearance: Normal appearance.  HENT:     Head: Normocephalic and atraumatic.  Eyes:     General: No scleral icterus.    Extraocular Movements: Extraocular movements intact.     Conjunctiva/sclera: Conjunctivae normal.  Cardiovascular:     Rate and Rhythm: Normal rate and regular rhythm.     Pulses: Normal pulses.     Heart sounds: Normal heart sounds.  Pulmonary:     Effort: Pulmonary effort is normal. No respiratory distress.     Breath sounds: Normal breath sounds.  Abdominal:     General: Bowel sounds are normal. There is no distension.     Palpations: Abdomen is soft. There is no mass.     Tenderness: There is no abdominal tenderness. There is no guarding.  Musculoskeletal:     Right lower leg: No edema.     Left lower leg: No edema.  Skin:    General: Skin is warm and dry.  Neurological:     Mental Status: She is  alert and oriented to person, place, and time. Mental status is at baseline.  Psychiatric:        Mood and Affect: Mood normal.        Behavior: Behavior normal.     No results found for any visits on 12/11/22.  Assessment & Plan    Follow-up exam, 3-6 months since previous exam  Chronic kidney disease, stage 3a (HCC) -     Comprehensive metabolic panel  Adult hypothyroidism -     TSH Rfx on Abnormal to Free T4  CAFL (chronic airflow limitation) (HCC)  Moderate dementia without behavioral disturbance, psychotic disturbance, mood disturbance, or anxiety, unspecified dementia type  (HCC)  Pre-op evaluation -     EKG 12-Lead  Osteoporosis screening declined  Lung cancer screening declined by patient  Nicotine dependence with current use   ECG interp *** NS-QIP Denies exacerbations of COPD in past year (antibiotic use only once, prescribed by dental)  ***  Return in about 6 months (around 06/11/2023) for chronic f/u.      I discussed the assessment and treatment plan with the patient  The patient was provided an opportunity to ask questions and all were answered. The patient agreed with the plan and demonstrated an understanding of the instructions.   The patient was advised to call back or seek an in-person evaluation if the symptoms worsen or if the condition fails to improve as anticipated.    Sherlyn Hay, DO  [facility] [phone] (phone) [phone] (fax)  [facility]

## 2022-12-12 LAB — COMPREHENSIVE METABOLIC PANEL
ALT: 16 [IU]/L (ref 0–32)
AST: 15 [IU]/L (ref 0–40)
Albumin: 4.3 g/dL (ref 3.8–4.8)
Alkaline Phosphatase: 79 [IU]/L (ref 44–121)
BUN/Creatinine Ratio: 17 (ref 12–28)
BUN: 19 mg/dL (ref 8–27)
Bilirubin Total: 0.3 mg/dL (ref 0.0–1.2)
CO2: 24 mmol/L (ref 20–29)
Calcium: 10.3 mg/dL (ref 8.7–10.3)
Chloride: 101 mmol/L (ref 96–106)
Creatinine, Ser: 1.11 mg/dL — ABNORMAL HIGH (ref 0.57–1.00)
Globulin, Total: 2.9 g/dL (ref 1.5–4.5)
Glucose: 78 mg/dL (ref 70–99)
Potassium: 4.3 mmol/L (ref 3.5–5.2)
Sodium: 138 mmol/L (ref 134–144)
Total Protein: 7.2 g/dL (ref 6.0–8.5)
eGFR: 51 mL/min/{1.73_m2} — ABNORMAL LOW (ref >59)

## 2022-12-12 LAB — T4F: T4,Free (Direct): 1.18 ng/dL (ref 0.82–1.77)

## 2022-12-12 LAB — TSH RFX ON ABNORMAL TO FREE T4: TSH: 5.8 u[IU]/mL — ABNORMAL HIGH (ref 0.450–4.500)

## 2022-12-17 ENCOUNTER — Encounter: Payer: Self-pay | Admitting: Podiatry

## 2022-12-17 ENCOUNTER — Ambulatory Visit (INDEPENDENT_AMBULATORY_CARE_PROVIDER_SITE_OTHER): Payer: 59 | Admitting: Podiatry

## 2022-12-17 VITALS — Ht 64.0 in | Wt 148.0 lb

## 2022-12-17 DIAGNOSIS — B351 Tinea unguium: Secondary | ICD-10-CM | POA: Diagnosis not present

## 2022-12-17 DIAGNOSIS — M79674 Pain in right toe(s): Secondary | ICD-10-CM

## 2022-12-17 DIAGNOSIS — M79675 Pain in left toe(s): Secondary | ICD-10-CM

## 2022-12-18 NOTE — Progress Notes (Signed)
  Subjective:  Patient ID: Katie Carter, female    DOB: 30-Jul-1945,  MRN: [id number]  77 y.o. female presents painful thick toenails that are difficult to trim. Pain interferes with ambulation. Aggravating factors include wearing enclosed shoe gear. Pain is relieved with periodic professional debridement. Her husband is present during today's visit. Chief Complaint  Patient presents with   Nail Problem    Pt is here for RFC, not a diabetic PCP is Dr Payton Mccallum and LOV was last week.   New problem(s): None   PCP is Sherlyn Hay, DO.  Allergies  Allergen Reactions   Meloxicam Other (See Comments)    Severe chest pain   Aspirin     vomiting   Tramadol Rash    Review of Systems: Negative except as noted in the HPI.   Objective:  Katie Carter is a pleasant 77 y.o. female WD, WN in NAD. AAO x 3.  Vascular Examination: Capillary refill time <3 seconds b/l LE. Palpable pedal pulses b/l LE. Digital hair present b/l. No pedal edema b/l. Skin temperature gradient WNL b/l. No varicosities b/l. Marland Kitchen  Dermatological Examination: Pedal skin with normal turgor, texture and tone b/l. No open wounds.  Toenails 1-5 b/l thickened, discolored, dystrophic with subungual debris. There is pain on palpation to dorsal aspect of nailplates.   Hyperkeratotic lesion(s) right 2nd digit PIPJ interdigitally  No erythema, no edema, no drainage, no fluctuance..  Neurological Examination: Protective sensation intact with 10 gram monofilament b/l LE. Vibratory sensation intact b/l LE.   Musculoskeletal Examination: Normal muscle strength 5/5 to all lower extremity muscle groups bilaterally. Hammertoe(s) noted to the 2-5 bilaterally. No pain, crepitus or joint limitation noted with ROM b/l LE.  Patient ambulates independently without assistive aids.  Last A1c:       No data to display         Assessment:   1. Pain due to onychomycosis of toenails of both feet    Plan:  -Patient's family member present.  All questions/concerns addressed on today's visit. -Will submit preauthorization to insurance company for paring of corn. -Toenails 1-5 b/l were debrided in length and girth with sterile nail nippers and dremel without iatrogenic bleeding.  -As a courtesy, corn(s) R 2nd toe pared utilizing sterile scalpel blade without complication or incident. Total number pared=1. -Patient/POA to call should there be question/concern in the interim.  Return in about 3 months (around 03/17/2023).  Freddie Breech, DPM      [facility] LOCATION: [address], [place] [address]                   Office [phone]   [facility] LOCATION: [address] [place], [place] [address] Office [phone]

## 2022-12-19 ENCOUNTER — Telehealth: Payer: Self-pay | Admitting: Family Medicine

## 2022-12-19 DIAGNOSIS — F17200 Nicotine dependence, unspecified, uncomplicated: Secondary | ICD-10-CM | POA: Insufficient documentation

## 2022-12-19 NOTE — Assessment & Plan Note (Signed)
Lives with her husband, who is her caregiver.

## 2022-12-19 NOTE — Telephone Encounter (Addendum)
Vernona Rieger, patient's sister-in-law, has called stating patient needs to have a dental procedure done with [facility] and had a surgical clearance appointment with Dr Jacquenette Shone on 12/11/22. Per Vernona Rieger, Dr Payton Mccallum approved patient to have this dental surgery done but that has not been communicated with [facility]. Vernona Rieger states [facility] has sent over a request for this, but has not got a reply back. Please advise and follow back up with the patient.   Patient's callback # [phone]  (Patient's partners callback as patient has dementia)  [facility] [address] [place],  [place], [place] [address] Phone: [phone]

## 2022-12-19 NOTE — Telephone Encounter (Signed)
Has anyone seen the form?  Maybe another provider can sign it since Dr. Payton Mccallum is on vacation.

## 2022-12-19 NOTE — Telephone Encounter (Signed)
We did receive this and I think it was placed in Dr. Senaida Lange box if I remember correctly.

## 2022-12-19 NOTE — Assessment & Plan Note (Signed)
COPD managed with daily Trelegy. No recent exacerbations or need for rescue inhaler. Mild wheezing resolved with coughing. Emphasized continuing Trelegy and close monitoring, especially preoperatively. - Continue Trelegy daily - Monitor respiratory status closely, especially preoperatively

## 2022-12-19 NOTE — Assessment & Plan Note (Signed)
Scheduled for dental surgery requiring general anesthesia. Concerns about respiratory status due to wheezing and Trelegy use. No recent chest pain, dyspnea, or other acute symptoms. Physical exam reveals clear lung sounds post-coughing.  - Ordered EKG - showed sinus rhythm with occasional PACs without ST changes, with rate of 68, PR interval 174, QRS 70 and QT/QTc 396/421 - Order metabolic panel - Revised Cardiac Risk Index score - 0 points; Class I risk, 3.9% 30-day risk of death, MI, or cardiac arrest

## 2022-12-20 NOTE — Telephone Encounter (Signed)
I have completed it and it will be placed in the fax box today

## 2023-02-04 ENCOUNTER — Other Ambulatory Visit: Payer: Self-pay | Admitting: Family Medicine

## 2023-02-04 DIAGNOSIS — Z130 Encounter for screening for diseases of the blood and blood-forming organs and certain disorders involving the immune mechanism: Secondary | ICD-10-CM | POA: Insufficient documentation

## 2023-02-04 DIAGNOSIS — E039 Hypothyroidism, unspecified: Secondary | ICD-10-CM

## 2023-02-04 DIAGNOSIS — Z862 Personal history of diseases of the blood and blood-forming organs and certain disorders involving the immune mechanism: Secondary | ICD-10-CM

## 2023-02-04 DIAGNOSIS — N1831 Chronic kidney disease, stage 3a: Secondary | ICD-10-CM

## 2023-02-12 ENCOUNTER — Ambulatory Visit (INDEPENDENT_AMBULATORY_CARE_PROVIDER_SITE_OTHER): Payer: 59

## 2023-02-12 VITALS — BP 130/74 | Ht 64.0 in | Wt 144.8 lb

## 2023-02-12 DIAGNOSIS — Z0000 Encounter for general adult medical examination without abnormal findings: Secondary | ICD-10-CM

## 2023-02-12 NOTE — Patient Instructions (Addendum)
 Ms. Millirons , Thank you for taking time to come for your Medicare Wellness Visit. I appreciate your ongoing commitment to your health goals. Please review the following plan we discussed and let me know if I can assist you in the future.   Referrals/Orders/Follow-Ups/Clinician Recommendations: NONE  This is a list of the screening recommended for you and due dates:  Health Maintenance  Topic Date Due   DTaP/Tdap/Td vaccine (1 - Tdap) Never done   Zoster (Shingles) Vaccine (1 of 2) Never done   Screening for Lung Cancer  Never done   DEXA scan (bone density measurement)  03/01/2016   Flu Shot  08/09/2022   COVID-19 Vaccine (5 - 2024-25 season) 09/09/2022   Medicare Annual Wellness Visit  02/12/2024   Pneumonia Vaccine  Completed   HPV Vaccine  Aged Out   Hepatitis C Screening  Discontinued    Advanced directives: (ACP Link)Information on Advanced Care Planning can be found at [place]  Secretary of [facility] Advance Health Care Directives Advance Health Care Directives ([email or web address]   Next Medicare Annual Wellness Visit scheduled for next year: Yes   02/18/24 @ 1:10 PM IN PERSON

## 2023-02-12 NOTE — Progress Notes (Signed)
 Subjective:   Katie Carter is a 78 y.o. female who presents for Medicare Annual (Subsequent) preventive examination.  Visit Complete: In person  Cardiac Risk Factors include: advanced age (>44men, >29 women);dyslipidemia;sedentary lifestyle;smoking/ tobacco exposure     Objective:    Today's Vitals   02/12/23 1124  BP: 130/74  Weight: 144 lb 12.8 oz (65.7 kg)  Height: 5' 4 (1.626 m)   Body mass index is 24.85 kg/m.     02/12/2023   11:38 AM 07/05/2022    8:05 AM 03/28/2022    8:28 AM 03/22/2020   10:00 AM 03/19/2019    9:31 AM 03/18/2018   10:06 AM 11/20/2017    7:45 PM  Advanced Directives  Does Patient Have a Medical Advance Directive? No No No No No No   Would patient like information on creating a medical advance directive? No - Patient declined No - Patient declined  No - Patient declined No - Patient declined No - Patient declined No - Patient declined    Current Medications (verified) Outpatient Encounter Medications as of 02/12/2023  Medication Sig   Fluticasone -Umeclidin-Vilant (TRELEGY ELLIPTA ) 100-62.5-25 MCG/ACT AEPB Inhale 1 puff into the lungs daily.   ondansetron  (ZOFRAN ) 4 MG tablet Take 1 tablet (4 mg total) by mouth every 8 (eight) hours as needed for nausea or vomiting.   pantoprazole  (PROTONIX ) 40 MG tablet Take 1 tablet (40 mg total) by mouth daily. Take in the morning before breakfast   rOPINIRole  (REQUIP ) 0.25 MG tablet TAKE 1 TABLET(0.25 MG) BY MOUTH TWICE DAILY   No facility-administered encounter medications on file as of 02/12/2023.    Allergies (verified) Meloxicam , Aspirin , and Tramadol   History: Past Medical History:  Diagnosis Date   Arthritis    Chronic airway obstruction (HCC)    Chronic kidney disease, stage 3a (HCC)    Complication of anesthesia    slow to wake   Dyspnea    Hypercholesteremia    Lumbago 05/06/2006   Moderate dementia without behavioral disturbance, psychotic disturbance, mood disturbance, or anxiety (HCC)     Nausea and vomiting 06/11/2022   Restless leg syndrome    Thyroid  disease 10/15/2005   Tobacco use disorder    Wears dentures    partial upper and lower   Past Surgical History:  Procedure Laterality Date   ABDOMINAL HYSTERECTOMY  1960   abdominal Hysterectomy with bilateral salpingo-oophorectomy   APPENDECTOMY     CATARACT EXTRACTION Right 10/2012   CATARACT EXTRACTION W/PHACO Left 07/05/2022   Procedure: CATARACT EXTRACTION PHACO AND INTRAOCULAR LENS PLACEMENT (IOC) LEFT 16.41 01:15.4;  Surgeon: Enola Feliciano Hugger, MD;  Location: [facility];  Service: Ophthalmology;  Laterality: Left;   Surgical Fixation  1981   of the left knee fracture   TOTAL KNEE ARTHROPLASTY Left 11/20/2017   Procedure: TOTAL KNEE ARTHROPLASTY hardware removal;  Surgeon: Leora Lynwood SAUNDERS, MD;  Location: [facility];  Service: Orthopedics;  Laterality: Left;   Family History  Problem Relation Age of Onset   Alzheimer's disease Mother    Cancer Brother    Seizures Daughter    Social History   Socioeconomic History   Marital status: Significant Other    Spouse name: divorced   Number of children: 2   Years of education: Not on file   Highest education level: 9th grade  Occupational History   Occupation: retired  Tobacco Use   Smoking status: Every Day    Current packs/day: 1.00    Average packs/day: 1 pack/day for  60.0 years (60.0 ttl pk-yrs)    Types: Cigarettes   Smokeless tobacco: Never   Tobacco comments:    Has been thinking about quitting 03/2020  started smoking as a teenager.  Vaping Use   Vaping status: Never Used  Substance and Sexual Activity   Alcohol use: Not Currently   Drug use: No   Sexual activity: Not on file  Other Topics Concern   Not on file  Social History Narrative   1 daughter living and has 1 deceased.   Social Drivers of Corporate Investment Banker Strain: Low Risk  (02/12/2023)   Overall Financial Resource Strain (CARDIA)    Difficulty of Paying Living  Expenses: Not hard at all  Food Insecurity: No Food Insecurity (02/12/2023)   Hunger Vital Sign    Worried About Running Out of Food in the Last Year: Never true    Ran Out of Food in the Last Year: Never true  Transportation Needs: No Transportation Needs (02/12/2023)   PRAPARE - Administrator, Civil Service (Medical): No    Lack of Transportation (Non-Medical): No  Physical Activity: Insufficiently Active (02/12/2023)   Exercise Vital Sign    Days of Exercise per Week: 2 days    Minutes of Exercise per Session: 20 min  Stress: No Stress Concern Present (02/12/2023)   [employer] of Occupational Health - Occupational Stress Questionnaire    Feeling of Stress : Not at all  Social Connections: Socially Isolated (02/12/2023)   Social Connection and Isolation Panel [NHANES]    Frequency of Communication with Friends and Family: Once a week    Frequency of Social Gatherings with Friends and Family: Once a week    Attends Religious Services: Never    Database Administrator or Organizations: No    Attends Banker Meetings: Never    Marital Status: Living with partner    Tobacco Counseling Ready to quit: Not Answered Counseling given: Not Answered Tobacco comments: Has been thinking about quitting 03/2020  started smoking as a teenager.   Clinical Intake:  Pre-visit preparation completed: Yes  Pain : No/denies pain     BMI - recorded: 24.85 Nutritional Status: BMI of 19-24  Normal Nutritional Risks: None Diabetes: No  How often do you need to have someone help you when you read instructions, pamphlets, or other written materials from your doctor or pharmacy?: 1 - Never  Interpreter Needed?: No  Information entered by :: JHONNIE DAS, LPN   Activities of Daily Living    02/12/2023   11:39 AM 07/05/2022    8:13 AM  In your present state of health, do you have any difficulty performing the following activities:  Hearing? 0   Vision? 0   Difficulty  concentrating or making decisions? 1 1  Walking or climbing stairs? 1   Comment PARTNER ASSISTS   Dressing or bathing? 0   Doing errands, shopping? 1   Preparing Food and eating ? N   Using the Toilet? N   In the past six months, have you accidently leaked urine? N   Do you have problems with loss of bowel control? N   Managing your Medications? Y   Managing your Finances? Y   Housekeeping or managing your Housekeeping? Y     Patient Care Team: Donzella Lauraine SAILOR, DO as PCP - General (Family Medicine) [facility], [facility] (Optometry)  Indicate any recent Medical Services you may have received from other than [facility] providers in  the past year (date may be approximate).     Assessment:   This is a routine wellness examination for Katie Carter.  Hearing/Vision screen Hearing Screening - Comments:: NO AIDS Vision Screening - Comments:: CATARACT SGY- [facility]   Goals Addressed             This Visit's Progress    DIET - INCREASE WATER INTAKE         Depression Screen    02/12/2023   11:35 AM 03/28/2022    8:20 AM 12/08/2021    9:32 AM 06/27/2021    1:50 PM 03/24/2021    9:13 AM 05/13/2020   10:41 AM 03/22/2020    9:56 AM  PHQ 2/9 Scores  PHQ - 2 Score 2 0 3 2  0 0  PHQ- 9 Score 3  3 3   0   Exception Documentation     --      Fall Risk    02/12/2023   11:38 AM 03/28/2022    8:17 AM 02/07/2022    2:01 PM 12/08/2021    9:32 AM 06/27/2021    1:49 PM  Fall Risk   Falls in the past year? 0 0 0  0  Number falls in past yr: 0 0  0 0  Injury with Fall? 0 0  0 0  Risk for fall due to : No Fall Risks No Fall Risks No Fall Risks  Mental status change  Follow up Falls prevention discussed;Falls evaluation completed Education provided;Falls prevention discussed Falls evaluation completed      MEDICARE RISK AT HOME: Medicare Risk at Home Any stairs in or around the home?: Yes If so, are there any without handrails?: No Home free of loose throw rugs in walkways, pet beds, electrical  cords, etc?: Yes Adequate lighting in your home to reduce risk of falls?: Yes Life alert?: No Use of a cane, walker or w/c?: No Grab bars in the bathroom?: No Shower chair or bench in shower?: Yes Elevated toilet seat or a handicapped toilet?: Yes  TIMED UP AND GO:  Was the test performed?  Yes  Length of time to ambulate 10 feet: 5 sec Gait slow and steady without use of assistive device    Cognitive Function:    08/08/2021    9:32 AM 03/24/2021   10:17 AM  MMSE - Mini Mental State Exam  Orientation to time 0 1  Orientation to Place 1 3  Registration 1 3  Attention/ Calculation 2 4  Recall 0 0  Language- name 2 objects 2 2  Language- repeat 1 1  Language- follow 3 step command 1 1  Language- read & follow direction 0 1  Write a sentence 1 1  Copy design 0 0  Total score 9 17        02/12/2023   11:41 AM 03/28/2022    8:33 AM  6CIT Screen  What Year? 4 points 0 points  What month? 3 points 0 points  What time? 0 points 3 points  Count back from 20 4 points 2 points  Months in reverse 4 points --  Repeat phrase 10 points --  Total Score 25 points     Immunizations Immunization History  Administered Date(s) Administered   Fluad Quad(high Dose 65+) 10/15/2019, 12/08/2021   Influenza, High Dose Seasonal PF 10/02/2016, 11/05/2017   Influenza-Unspecified 11/08/2020   PFIZER(Purple Top)SARS-COV-2 Vaccination 03/29/2019, 04/19/2019, 01/20/2020   Pfizer(Comirnaty)Fall Seasonal Vaccine 12 years and older 12/08/2021   Pneumococcal Conjugate-13 10/02/2016  Pneumococcal Polysaccharide-23 11/05/2017    TDAP status: Due, Education has been provided regarding the importance of this vaccine. Advised may receive this vaccine at local pharmacy or Health Dept. Aware to provide a copy of the vaccination record if obtained from local pharmacy or Health Dept. Verbalized acceptance and understanding.  Flu Vaccine status: Up to date- HAD AT [facility] [place]  Pneumococcal vaccine  status: Declined,  Education has been provided regarding the importance of this vaccine but patient still declined. Advised may receive this vaccine at local pharmacy or Health Dept. Aware to provide a copy of the vaccination record if obtained from local pharmacy or Health Dept. Verbalized acceptance and understanding.   Covid-19 vaccine status: Completed vaccines  Qualifies for Shingles Vaccine? Yes   Zostavax completed No   Shingrix Completed?: No.    Education has been provided regarding the importance of this vaccine. Patient has been advised to call insurance company to determine out of pocket expense if they have not yet received this vaccine. Advised may also receive vaccine at local pharmacy or Health Dept. Verbalized acceptance and understanding.  Screening Tests Health Maintenance  Topic Date Due   DTaP/Tdap/Td (1 - Tdap) Never done   Zoster Vaccines- Shingrix (1 of 2) Never done   Lung Cancer Screening  Never done   DEXA SCAN  03/01/2016   INFLUENZA VACCINE  08/09/2022   COVID-19 Vaccine (5 - 2024-25 season) 09/09/2022   Medicare Annual Wellness (AWV)  02/12/2024   Pneumonia Vaccine 57+ Years old  Completed   HPV VACCINES  Aged Out   Hepatitis C Screening  Discontinued    Health Maintenance  Health Maintenance Due  Topic Date Due   DTaP/Tdap/Td (1 - Tdap) Never done   Zoster Vaccines- Shingrix (1 of 2) Never done   Lung Cancer Screening  Never done   DEXA SCAN  03/01/2016   INFLUENZA VACCINE  08/09/2022   COVID-19 Vaccine (5 - 2024-25 season) 09/09/2022    Colorectal cancer screening: No longer required.   Mammogram status: No longer required due to AGE.  DECLINED REFERRAL FOR BDS  Lung Cancer Screening: (Low Dose CT Chest recommended if Age 25-80 years, 20 pack-year currently smoking OR have quit w/in 15years.) does not qualify.   Additional Screening:  Hepatitis C Screening: does not qualify; Completed 03/23/19  Vision Screening: Recommended annual  ophthalmology exams for early detection of glaucoma and other disorders of the eye. Is the patient up to date with their annual eye exam?  Yes  Who is the provider or what is the name of the office in which the patient attends annual eye exams? [facility] If pt is not established with a provider, would they like to be referred to a provider to establish care? No .   Dental Screening: Recommended annual dental exams for proper oral hygiene   Community Resource Referral / Chronic Care Management: CRR required this visit?  No   CCM required this visit?  No     Plan:     I have personally reviewed and noted the following in the patient's chart:   Medical and social history Use of alcohol, tobacco or illicit drugs  Current medications and supplements including opioid prescriptions. Patient is not currently taking opioid prescriptions. Functional ability and status Nutritional status Physical activity Advanced directives List of other physicians Hospitalizations, surgeries, and ER visits in previous 12 months Vitals Screenings to include cognitive, depression, and falls Referrals and appointments  In addition, I have reviewed and  discussed with patient certain preventive protocols, quality metrics, and best practice recommendations. A written personalized care plan for preventive services as well as general preventive health recommendations were provided to patient.     Jhonnie GORMAN Das, LPN   07/12/7972   After Visit Summary: (In Person-Declined) Patient declined AVS at this time.  Nurse Notes: NONE

## 2023-03-18 ENCOUNTER — Encounter: Payer: Self-pay | Admitting: Podiatry

## 2023-03-18 ENCOUNTER — Ambulatory Visit (INDEPENDENT_AMBULATORY_CARE_PROVIDER_SITE_OTHER): Payer: 59 | Admitting: Podiatry

## 2023-03-18 DIAGNOSIS — M79675 Pain in left toe(s): Secondary | ICD-10-CM

## 2023-03-18 DIAGNOSIS — B351 Tinea unguium: Secondary | ICD-10-CM | POA: Diagnosis not present

## 2023-03-18 DIAGNOSIS — M79674 Pain in right toe(s): Secondary | ICD-10-CM | POA: Diagnosis not present

## 2023-03-18 NOTE — Progress Notes (Signed)
  Patient ID: Katie Carter, female    DOB: 22-Dec-1945,  MRN: [id number]  Katie Carter presents to clinic today for painful, elongated thickened toenails x 10 which are symptomatic when wearing enclosed shoe gear. This interferes with his/her daily activities. Patient has h/o dementia and is accompanied by her husband on today's visit.   New problem(s): None.   PCP is Sherlyn Hay, DO.  Allergies  Allergen Reactions   Meloxicam Other (See Comments)    Severe chest pain   Aspirin     vomiting   Tramadol Rash    Review of Systems: Negative except as noted in the HPI.  Objective: No changes noted in today's physical examination. There were no vitals filed for this visit. Katie Carter is a pleasant 78 y.o. female WD, WN in NAD. AAO x 3. Vascular Examination: Capillary refill time <3 seconds b/l LE. Palpable pedal pulses b/l LE. Digital hair present b/l. No pedal edema b/l. Skin temperature gradient WNL b/l. No varicosities b/l. Marland Kitchen  Dermatological Examination: Pedal skin with normal turgor, texture and tone b/l. No open wounds.  Toenails 1-5 b/l thickened, discolored, dystrophic with subungual debris. There is pain on palpation to dorsal aspect of nailplates.   Hyperkeratotic lesion(s) right 2nd digit PIPJ interdigitally  No erythema, no edema, no drainage, no fluctuance..  Neurological Examination: Protective sensation intact with 10 gram monofilament b/l LE. Vibratory sensation intact b/l LE.   Musculoskeletal Examination: Normal muscle strength 5/5 to all lower extremity muscle groups bilaterally. Hammertoe(s) noted to the 2-5 bilaterally. No pain, crepitus or joint limitation noted with ROM b/l LE.  Patient ambulates independently without assistive aids.  Assessment/Plan: No diagnosis found.   Patient was evaluated and treated. All patient's and/or POA's questions/concerns addressed on today's visit. Mycotic toenails 1-5 debrided in length and girth without incident. Continue  soft, supportive shoe gear daily. Report any pedal injuries to medical professional. Call office if there are any quesitons/concerns. -Patient/POA to call should there be question/concern in the interim.   Return in about 3 months (around 06/18/2023).  Freddie Breech, DPM      [facility] LOCATION: [address] [place] [address]                   Office [phone]   [facility] LOCATION: [address] [place], [place] [address] Office [phone]

## 2023-06-12 ENCOUNTER — Ambulatory Visit: Payer: Self-pay | Admitting: Family Medicine

## 2023-06-12 ENCOUNTER — Encounter: Payer: Self-pay | Admitting: Family Medicine

## 2023-06-12 VITALS — BP 121/62 | HR 67 | Resp 14 | Ht 64.0 in | Wt 140.5 lb

## 2023-06-12 DIAGNOSIS — F03B0 Unspecified dementia, moderate, without behavioral disturbance, psychotic disturbance, mood disturbance, and anxiety: Secondary | ICD-10-CM

## 2023-06-12 DIAGNOSIS — K219 Gastro-esophageal reflux disease without esophagitis: Secondary | ICD-10-CM | POA: Diagnosis not present

## 2023-06-12 DIAGNOSIS — J449 Chronic obstructive pulmonary disease, unspecified: Secondary | ICD-10-CM

## 2023-06-12 DIAGNOSIS — G2581 Restless legs syndrome: Secondary | ICD-10-CM

## 2023-06-12 DIAGNOSIS — N1831 Chronic kidney disease, stage 3a: Secondary | ICD-10-CM

## 2023-06-12 MED ORDER — TRELEGY ELLIPTA 100-62.5-25 MCG/ACT IN AEPB: 1.0000 | INHALATION_SPRAY | Freq: Every day | RESPIRATORY_TRACT | 3 refills | Status: DC

## 2023-06-12 MED ORDER — PANTOPRAZOLE SODIUM 40 MG PO TBEC: 40.0000 mg | DELAYED_RELEASE_TABLET | Freq: Every day | ORAL | 3 refills | Status: DC

## 2023-06-12 MED ORDER — AEROCHAMBER MV MISC
2 refills | Status: AC
Start: 2023-06-12 — End: ?

## 2023-06-12 MED ORDER — ROPINIROLE HCL 0.25 MG PO TABS
0.2500 mg | ORAL_TABLET | Freq: Two times a day (BID) | ORAL | 3 refills | Status: AC
Start: 2023-06-12 — End: ?

## 2023-06-12 MED ORDER — DONEPEZIL HCL 5 MG PO TBDP: 5.0000 mg | ORAL_TABLET | Freq: Every day | ORAL | 1 refills | Status: DC

## 2023-06-12 NOTE — Patient Instructions (Signed)
 Recommended vaccines: Tdap (tetanus, diphtheria and pertussis (whooping cough)) and shingrix (shingles)

## 2023-06-12 NOTE — Progress Notes (Signed)
 Established patient visit   Patient: Katie Carter   DOB: [birth year]   78 y.o. Female  MRN: [id number] Visit Date: 06/12/2023  Today's healthcare provider: Carlean Charter, DO   Chief Complaint  Patient presents with   Follow-up    Pt has alzheimers would like to discuss   Subjective    HPI AYALA RIBBLE "Garey Jung" is an 78 year old female with Alzheimer's disease who presents for a discussion about her condition.  She has some recent worsening of her memory, such as thinking she was here to visit her sister. There is a family history of Alzheimer's disease, as her sister is also affected and her mother died from the disease. She requires close supervision at home, but no wandering behavior has been noted.  She uses an inhaler daily for COPD management, which her caregiver encourages her to take. She has a history of lung cancer and continues to smoke, complicating her respiratory condition. She has received the COVID, flu, and pneumonia vaccines but has not had the shingles or tetanus vaccines.  She is currently taking ropinirole  for restless leg syndrome, which she takes twice a day. Her symptoms have improved with this regimen, as her legs used to 'jump' when she was asleep.  No recent illness or exposure to sick individuals. No shortness of breath on exertion      Medications: Outpatient Medications Prior to Visit  Medication Sig   ondansetron  (ZOFRAN ) 4 MG tablet Take 1 tablet (4 mg total) by mouth every 8 (eight) hours as needed for nausea or vomiting.   [DISCONTINUED] Fluticasone -Umeclidin-Vilant (TRELEGY ELLIPTA ) 100-62.5-25 MCG/ACT AEPB Inhale 1 puff into the lungs daily.   [DISCONTINUED] pantoprazole  (PROTONIX ) 40 MG tablet Take 1 tablet (40 mg total) by mouth daily. Take in the morning before breakfast   [DISCONTINUED] rOPINIRole  (REQUIP ) 0.25 MG tablet TAKE 1 TABLET(0.25 MG) BY MOUTH TWICE DAILY   No facility-administered medications prior to visit.    Review  of Systems  Unable to perform ROS: Dementia  Psychiatric/Behavioral:  Negative for behavioral problems.         Objective    BP 121/62 (BP Location: Right Arm, Patient Position: Sitting, Cuff Size: Normal)   Pulse 67   Resp 14   Ht 5\' 4"  (1.626 m)   Wt 140 lb 8 oz (63.7 kg)   SpO2 97%   BMI 24.12 kg/m     Physical Exam Constitutional:      Appearance: Normal appearance.  HENT:     Head: Normocephalic and atraumatic.  Eyes:     General: No scleral icterus.    Extraocular Movements: Extraocular movements intact.     Conjunctiva/sclera: Conjunctivae normal.  Cardiovascular:     Rate and Rhythm: Normal rate and regular rhythm.     Pulses: Normal pulses.     Heart sounds: Normal heart sounds.  Pulmonary:     Effort: Pulmonary effort is normal. No respiratory distress.     Breath sounds: Normal breath sounds.  Musculoskeletal:     Right lower leg: No edema.     Left lower leg: No edema.  Skin:    General: Skin is warm and dry.  Neurological:     Mental Status: She is alert and oriented to person, place, and time. Mental status is at baseline.  Psychiatric:        Mood and Affect: Mood normal.        Behavior: Behavior normal.  No results found for any visits on 06/12/23.  Assessment & Plan    Moderate dementia without behavioral disturbance, psychotic disturbance, mood disturbance, or anxiety, unspecified dementia type (HCC) -     AMB Referral VBCI Care Management -     Donepezil HCl; Take 1 tablet (5 mg total) by mouth at bedtime.  Dispense: 90 tablet; Refill: 1  Restless leg -     rOPINIRole  HCl; Take 1 tablet (0.25 mg total) by mouth 2 (two) times daily.  Dispense: 180 tablet; Refill: 3  Chronic kidney disease, stage 3a (HCC)  CAFL (chronic airflow limitation) (HCC) -     Trelegy Ellipta ; Inhale 1 puff into the lungs daily.  Dispense: 180 each; Refill: 3 -     AeroChamber MV; Use as instructed  Dispense: 1 each; Refill: 2  Gastroesophageal reflux  disease, unspecified whether esophagitis present -     Pantoprazole  Sodium; Take 1 tablet (40 mg total) by mouth daily. Take in the morning before breakfast  Dispense: 90 tablet; Refill: 3      Alzheimer's Disease Significant dementia progression with severe memory impairment. Discussed memantine and donepezil for memory support. - Prescribe memantine or donepezil for memory support. - Refer to social work for Alzheimer's care resources.  Chronic airflow limitation Continues to smoke, complicating COPD and lung cancer. Inhaler effective but used regularly, though caregiver sometimes has trouble getting her to take it due to her dementia. - Ensure inhaler prescription allows for a three-month supply. - Encourage smoking cessation.  Restless Leg Syndrome Ropinirole  effectively reduces restlessness, especially during sleep. - Continue ropinirole  as prescribed.  Chronic kidney disease stage 3a Continue to optimize risk factors, with consideration for patient's dementia.  General Health Maintenance Received COVID, flu, pneumonia, and RSV vaccines. Declined shingles and tetanus vaccines. Explained tetanus vaccine benefits. - Administer flu vaccine in the fall. - Get COVID vaccine at the pharmacy in the fall. - Respect her caregiver's preference to decline shingles and tetanus vaccines.  Follow-up Prefers blood work at the next visit for vaccinations. - Schedule blood work at the next visit for vaccinations.    Return in about 6 months (around 12/12/2023) for Chronic f/u.      I discussed the assessment and treatment plan with the patient  The patient was provided an opportunity to ask questions and all were answered. The patient agreed with the plan and demonstrated an understanding of the instructions.   The patient was advised to call back or seek an in-person evaluation if the symptoms worsen or if the condition fails to improve as anticipated.    Carlean Charter, DO  [facility]  [facility] [phone] (phone) [phone] (fax)  [facility]

## 2023-06-17 ENCOUNTER — Encounter: Payer: Self-pay | Admitting: Podiatry

## 2023-06-17 ENCOUNTER — Ambulatory Visit (INDEPENDENT_AMBULATORY_CARE_PROVIDER_SITE_OTHER): Admitting: Podiatry

## 2023-06-17 DIAGNOSIS — B351 Tinea unguium: Secondary | ICD-10-CM | POA: Diagnosis not present

## 2023-06-17 DIAGNOSIS — M79674 Pain in right toe(s): Secondary | ICD-10-CM

## 2023-06-17 DIAGNOSIS — M79675 Pain in left toe(s): Secondary | ICD-10-CM | POA: Diagnosis not present

## 2023-06-17 NOTE — Progress Notes (Signed)
  Subjective:  Patient ID: Katie Carter, female    DOB: 08-10-1945,  MRN: [id number]  Katie Carter presents to clinic today for painful elongated mycotic toenails 1-5 bilaterally which are tender when wearing enclosed shoe gear. Pain is relieved with periodic professional debridement. She is accompanied by family member on today's visit. Chief Complaint  Patient presents with   routine foot care    Thick, painful toenails, 3 month follow up   New problem(s): None.   PCP is Carlean Charter, DO.  Allergies  Allergen Reactions   Meloxicam  Other (See Comments)    Severe chest pain   Aspirin      vomiting   Tramadol Rash    Review of Systems: Negative except as noted in the HPI.  Objective:  There were no vitals filed for this visit. Katie Carter is a pleasant 78 y.o. female WD, WN in NAD. AAO x 3.  Vascular Examination: Capillary refill time <3 seconds b/l LE. Palpable pedal pulses b/l LE. Digital hair present b/l. No pedal edema b/l. Skin temperature gradient WNL b/l. No varicosities b/l. Aaron Aas  Dermatological Examination: Pedal skin with normal turgor, texture and tone b/l. No open wounds.  Toenails 1-5 b/l thickened, discolored, dystrophic with subungual debris. There is pain on palpation to dorsal aspect of nailplates.   Hyperkeratotic lesion(s) submet 5 left foot. No erythema, no edema, no drainage, no fluctuance..  Neurological Examination: Protective sensation intact with 10 gram monofilament b/l LE. Vibratory sensation intact b/l LE.   Musculoskeletal Examination: Normal muscle strength 5/5 to all lower extremity muscle groups bilaterally. Hammertoe(s) noted to the 2-5 bilaterally. No pain, crepitus or joint limitation noted with ROM b/l LE.  Patient ambulates independently without assistive aids.  Assessment/Plan: 1. Pain due to onychomycosis of toenails of both feet     Consent given for treatment. Patient examined. All patient's and/or POA's questions/concerns  addressed on today's visit.Toenails 1-5 debrided in length and girth without incident. As a courtesy, callus filed with dremel submet head 5 left foot. Continue soft, supportive shoe gear daily. Report any pedal injuries to medical professional. Call office if there are any questions/concerns. -Patient/POA to call should there be question/concern in the interim.   Return in about 3 months (around 09/17/2023).  Luella Sager, DPM      [facility] LOCATION: [address], [place] [address]                   Office [phone]   [facility] LOCATION: [address] [place], [place] [address] Office [phone]

## 2023-06-21 ENCOUNTER — Other Ambulatory Visit: Payer: Self-pay | Admitting: Family Medicine

## 2023-06-21 DIAGNOSIS — G2581 Restless legs syndrome: Secondary | ICD-10-CM

## 2023-06-27 ENCOUNTER — Telehealth: Payer: Self-pay

## 2023-06-27 NOTE — Progress Notes (Signed)
 Complex Care Management Note  Care Guide Note 06/27/2023 Name: [name] MRN: [id number] DOB: Mar 31, 1945  Katie Carter is a 78 y.o. year old female who sees Pardue, Asencion Blacksmith, DO for primary care. I reached out to Katie Carter by phone today to offer complex care management services.  Ms. Potteiger was given information about Complex Care Management services today including:   The Complex Care Management services include support from the care team which includes your Nurse Care Manager, Clinical Social Worker, or Pharmacist.  The Complex Care Management team is here to help remove barriers to the health concerns and goals most important to you. Complex Care Management services are voluntary, and the patient may decline or stop services at any time by request to their care team member.   Complex Care Management Consent Status: Patient agreed to services and verbal consent obtained.   Follow up plan:  Telephone appointment with complex care management team member scheduled for:  07/11/23 at 10:00 a.m.   Encounter Outcome:  Patient Scheduled  [facility]  [facility], [facility] Care Management Assistant Direct Dial: [phone]  Fax: [phone]

## 2023-07-11 ENCOUNTER — Other Ambulatory Visit: Payer: Self-pay | Admitting: *Deleted

## 2023-07-11 NOTE — Patient Outreach (Signed)
 Phone call to patient's significant other [name] Vonn and friend Leita Vonn to complete initial assessment. Dorrene Vonn declined services at this time. Declined follow up at a later date. CSW's contact number provided in the event that community resource need arise in the future.   Zachory Mangual, LCSW   [facility], [facility] Licensed Clinical Social Worker  Direct Dial: [phone]

## 2023-09-20 ENCOUNTER — Ambulatory Visit (INDEPENDENT_AMBULATORY_CARE_PROVIDER_SITE_OTHER): Admitting: Podiatry

## 2023-09-20 ENCOUNTER — Encounter: Payer: Self-pay | Admitting: Podiatry

## 2023-09-20 DIAGNOSIS — M79674 Pain in right toe(s): Secondary | ICD-10-CM | POA: Diagnosis not present

## 2023-09-20 DIAGNOSIS — B351 Tinea unguium: Secondary | ICD-10-CM

## 2023-09-20 DIAGNOSIS — M79675 Pain in left toe(s): Secondary | ICD-10-CM

## 2023-09-23 ENCOUNTER — Encounter: Payer: Self-pay | Admitting: Podiatry

## 2023-09-23 NOTE — Progress Notes (Signed)
  Subjective:  Patient ID: Katie Carter, female    DOB: 08/21/1945,  MRN: [id number]  Katie Carter presents to clinic today for painful thick toenails that are difficult to trim. Pain interferes with ambulation. Aggravating factors include wearing enclosed shoe gear. Pain is relieved with periodic professional debridement. She is accompanied by family member on today's visit. Chief Complaint  Patient presents with   RFC    Rm1 Routine foot care Dr. Lauraine Buoy last visit June 2025   New problem(s): None.   PCP is Buoy Lauraine SAILOR, DO.  Allergies  Allergen Reactions   Meloxicam  Other (See Comments)    Severe chest pain   Aspirin      vomiting   Tramadol Rash    Review of Systems: Negative except as noted in the HPI.  Objective: No changes noted in today's physical examination. There were no vitals filed for this visit. Katie Carter is a pleasant 78 y.o. female WD, WN in NAD. AAO x 3.  Vascular Examination: Capillary refill time <3 seconds b/l LE. Palpable pedal pulses b/l LE. Digital hair present b/l. No pedal edema b/l. Skin temperature gradient WNL b/l. No varicosities b/l. SABRA  Dermatological Examination: Pedal skin with normal turgor, texture and tone b/l. No open wounds.  Toenails 1-5 b/l thickened, discolored, dystrophic with subungual debris. There is pain on palpation to dorsal aspect of nailplates.   Minimal hyperkeratotic lesion(s) submet 5 left foot. No erythema, no edema, no drainage, no fluctuance..  Neurological Examination: Protective sensation intact with 10 gram monofilament b/l LE. Vibratory sensation intact b/l LE.   Musculoskeletal Examination: Normal muscle strength 5/5 to all lower extremity muscle groups bilaterally. Hammertoe(s) noted to the 2-5 bilaterally. No pain, crepitus or joint limitation noted with ROM b/l LE.  Patient ambulates independently without assistive aids.  Assessment/Plan: 1. Pain due to onychomycosis of toenails of both feet    Patient was evaluated and treated. All patient's and/or POA's questions/concerns addressed on today's visit. Mycotic toenails 1-5 debrided in length and girth without incident. Continue soft, supportive shoe gear daily. Report any pedal injuries to medical professional. Call office if there are any quesitons/concerns. -Patient/POA to call should there be question/concern in the interim.   Return in about 3 months (around 12/20/2023).  Katie Carter, DPM       LOCATION: [address] [place] [address]                   Office [phone]   [facility] LOCATION: [address] [place], [place] [address] Office [phone]

## 2023-12-12 ENCOUNTER — Ambulatory Visit: Admitting: Family Medicine

## 2023-12-20 ENCOUNTER — Ambulatory Visit: Admitting: Podiatry

## 2023-12-20 ENCOUNTER — Encounter: Payer: Self-pay | Admitting: Podiatry

## 2023-12-20 DIAGNOSIS — B351 Tinea unguium: Secondary | ICD-10-CM | POA: Diagnosis not present

## 2023-12-20 DIAGNOSIS — S91302A Unspecified open wound, left foot, initial encounter: Secondary | ICD-10-CM

## 2023-12-20 DIAGNOSIS — M79674 Pain in right toe(s): Secondary | ICD-10-CM | POA: Diagnosis not present

## 2023-12-20 DIAGNOSIS — M79675 Pain in left toe(s): Secondary | ICD-10-CM

## 2023-12-20 MED ORDER — DOXYCYCLINE HYCLATE 100 MG PO CAPS: 100.0000 mg | ORAL_CAPSULE | Freq: Two times a day (BID) | ORAL | 0 refills | Status: AC

## 2023-12-20 MED ORDER — WOUND WASH SALINE 0.9 % EX SOLN: CUTANEOUS | 0 refills | Status: AC

## 2023-12-20 MED ORDER — GENTAMICIN SULFATE 0.1 % EX CREA: TOPICAL_CREAM | CUTANEOUS | 1 refills | Status: AC

## 2023-12-20 NOTE — Patient Instructions (Signed)
° °  DRESSING CHANGES LEFT FOOT:   PHARMACY SHOPPING LIST: Saline or Wound Cleanser for cleaning wound 2 x 2 inch sterile gauze for cleaning wound GENTAMICIN CREAM   IF PRESCRIBED ORAL ANTIBIOTICS, TAKE ALL MEDICATION AS PRESCRIBED UNTIL ALL ARE GONE.  KEEP LEFT FOOT DRY AT ALL TIMES!!!!  CLEANSE WOUND WITH SALINE OR WOUND CLEANSER.  DAB DRY WITH GAUZE SPONGE.  APPLY A LIGHT AMOUNT OF GENTAMICIN CREAM TO WOUND.  APPLY OUTER DRESSING AS INSTRUCTED.  DO NOT WALK BAREFOOT!!!  IF YOU EXPERIENCE ANY FEVER, CHILLS, NIGHTSWEATS, NAUSEA OR VOMITING, ELEVATED OR LOW BLOOD SUGARS, REPORT TO EMERGENCY ROOM.  IF YOU EXPERIENCE INCREASED REDNESS, PAIN, SWELLING, DISCOLORATION, ODOR, PUS, DRAINAGE OR WARMTH OF YOUR FOOT, REPORT TO EMERGENCY ROOM.

## 2023-12-24 ENCOUNTER — Encounter: Payer: Self-pay | Admitting: Family Medicine

## 2023-12-24 ENCOUNTER — Ambulatory Visit (INDEPENDENT_AMBULATORY_CARE_PROVIDER_SITE_OTHER): Admitting: Family Medicine

## 2023-12-24 VITALS — BP 137/70 | HR 72 | Temp 98.1°F | Ht 64.0 in | Wt 141.9 lb

## 2023-12-24 DIAGNOSIS — Z79899 Other long term (current) drug therapy: Secondary | ICD-10-CM | POA: Diagnosis not present

## 2023-12-24 DIAGNOSIS — G2581 Restless legs syndrome: Secondary | ICD-10-CM

## 2023-12-24 DIAGNOSIS — N1831 Chronic kidney disease, stage 3a: Secondary | ICD-10-CM | POA: Diagnosis not present

## 2023-12-24 DIAGNOSIS — F03B0 Unspecified dementia, moderate, without behavioral disturbance, psychotic disturbance, mood disturbance, and anxiety: Secondary | ICD-10-CM

## 2023-12-24 DIAGNOSIS — K219 Gastro-esophageal reflux disease without esophagitis: Secondary | ICD-10-CM

## 2023-12-24 DIAGNOSIS — E039 Hypothyroidism, unspecified: Secondary | ICD-10-CM

## 2023-12-24 DIAGNOSIS — J449 Chronic obstructive pulmonary disease, unspecified: Secondary | ICD-10-CM

## 2023-12-24 MED ORDER — PANTOPRAZOLE SODIUM 40 MG PO TBEC: 40.0000 mg | DELAYED_RELEASE_TABLET | Freq: Every day | ORAL | 3 refills | Status: AC

## 2023-12-24 MED ORDER — TRELEGY ELLIPTA 100-62.5-25 MCG/ACT IN AEPB: 1.0000 | INHALATION_SPRAY | Freq: Every day | RESPIRATORY_TRACT | 1 refills | Status: AC

## 2023-12-24 MED ORDER — DONEPEZIL HCL 5 MG PO TBDP: 5.0000 mg | ORAL_TABLET | Freq: Every day | ORAL | 1 refills | Status: AC

## 2023-12-24 NOTE — Assessment & Plan Note (Signed)
 Gastroesophageal reflux disease GERD controlled with pantoprazole . - Sent prescription for pantoprazole  to pharmacy. - Recheck vitamin B12 level due to ongoing PPI use and report of worsening dementia.

## 2023-12-24 NOTE — Progress Notes (Signed)
 Established patient visit   Patient: Katie Carter   DOB: 1945/04/30   78 y.o. Female  MRN: [id number] Visit Date: 12/24/2023  Today's healthcare provider: LAURAINE LOISE BUOY, DO   Chief Complaint  Patient presents with   Medical Management of Chronic Issues    Patient is here today for a 6 month follow up on chronic management.  Expresses no concerns.   Family member states that her Alzheimer's is getting worse.  Flu Vaccine- stated that she had already got it, [employer] October.   Subjective    HPI Katie Carter is an 78 year old female with Alzheimer's disease who presents with worsening memory.  She experiences worsening memory issues, characterized by forgetting things and hiding items, then being unable to recall their location. She has not been taking donepezil , a medication prescribed for Alzheimer's, as her husband was unaware of it being sent to the pharmacy in June. Family history is significant for Alzheimer's disease, with her mother and sister also affected.  She is currently taking pantoprazole  for reflux, which helps significantly. She ran out of this medication this morning and requires a refill. No recent nausea or vomiting.  She has a history of restless leg syndrome, previously managed with Requip . Her caregiver notes that the symptoms have decreased significantly, and she has not been taking the medication regularly. Her legs used to 'jump' during sleep, but this has not been occurring recently.  She has a history of knee replacement surgery and experiences limping upon standing, which improves after walking for a short period.   She has been taking Trelegy daily for respiratory issues, although she sometimes skips doses due to disliking the taste.  No trouble with breathing or coughing.  She is still smoking,. Her caregiver is attempting to reduce her smoking frequency and believes she would not be able to tolerate full cessation due to her  dementia.      Medications: Show/hide medication list[1]  Review of Systems  Constitutional:  Negative for appetite change, chills, fatigue and fever.  Respiratory:  Negative for chest tightness and shortness of breath.   Cardiovascular:  Negative for chest pain and palpitations.  Gastrointestinal:  Negative for abdominal pain, nausea and vomiting.  Neurological:  Negative for dizziness and weakness.        Objective    BP 137/70 (BP Location: Right Arm, Patient Position: Sitting, Cuff Size: Normal)   Pulse 72   Temp 98.1 F (36.7 C) (Oral)   Ht 5' 4 (1.626 m)   Wt 141 lb 14.4 oz (64.4 kg)   SpO2 96%   BMI 24.36 kg/m     Physical Exam Constitutional:      Appearance: Normal appearance.  HENT:     Head: Normocephalic and atraumatic.  Eyes:     General: No scleral icterus.    Extraocular Movements: Extraocular movements intact.     Conjunctiva/sclera: Conjunctivae normal.  Cardiovascular:     Rate and Rhythm: Normal rate and regular rhythm.     Pulses: Normal pulses.     Heart sounds: Normal heart sounds.  Pulmonary:     Effort: Pulmonary effort is normal. No respiratory distress.     Breath sounds: Wheezing (upper lobes and right middle lobe) present.  Abdominal:     General: Bowel sounds are normal. There is no distension.     Palpations: Abdomen is soft. There is no mass.     Tenderness: There is no abdominal tenderness.  There is no guarding.  Musculoskeletal:     Right lower leg: No edema.     Left lower leg: No edema.  Skin:    General: Skin is warm and dry.  Neurological:     Mental Status: She is alert and oriented to person, place, and time. Mental status is at baseline.  Psychiatric:        Mood and Affect: Mood normal.        Behavior: Behavior normal.      No results found for any visits on 12/24/23.  Assessment & Plan    Gastroesophageal reflux disease, unspecified whether esophagitis present Assessment & Plan: Gastroesophageal reflux  disease GERD controlled with pantoprazole . - Sent prescription for pantoprazole  to pharmacy. - Recheck vitamin B12 level due to ongoing PPI use and report of worsening dementia.  Orders: -     Pantoprazole  Sodium; Take 1 tablet (40 mg total) by mouth daily. Take in the morning before breakfast  Dispense: 90 tablet; Refill: 3 -     Vitamin B12  CAFL (chronic airflow limitation) (HCC) Assessment & Plan: Chronic, managed with daily Trelegy but with inconsistent adherence.  No recent exacerbations.  - Counseled on medication adherence.   - Offered nebulizer in place of Trelelgy; patient's husband declined preferring to focus on being more stringent with patient using her Trelegy.  Orders: -     Trelegy Ellipta ; Inhale 1 puff into the lungs daily.  Dispense: 180 each; Refill: 1  Chronic kidney disease, stage 3a (HCC) Assessment & Plan: Noted. No acute concerns.  Continue to monitor.    Orders: -     Comprehensive metabolic panel with GFR  Moderate dementia without behavioral disturbance, psychotic disturbance, mood disturbance, or anxiety, unspecified dementia type (HCC) Assessment & Plan: Worsening memory. Donepezil  not initiated due to lack of awareness. - Re-sent prescription for donepezil  to pharmacy. - Instructed to take donepezil  at bedtime with other medications.  Orders: -     Donepezil  HCl; Take 1 tablet (5 mg total) by mouth at bedtime.  Dispense: 90 tablet; Refill: 1  High risk medication use -     Vitamin B12  Adult hypothyroidism Assessment & Plan: Previous mild elevations of TSH, not currently on medication. Will recheck level today.   Orders: -     TSH Rfx on Abnormal to Free T4  Restless leg Assessment & Plan: Requip  discontinued. - Consider restarting Requip  if symptoms recur, especially before sleep.     General health maintenance Received flu and COVID vaccinations. Blood work due. - Ordered blood work.    Return in about 6 months (around  06/23/2024).      I discussed the assessment and treatment plan with the patient  The patient was provided an opportunity to ask questions and all were answered. The patient agreed with the plan and demonstrated an understanding of the instructions.   The patient was advised to call back or seek an in-person evaluation if the symptoms worsen or if the condition fails to improve as anticipated.    LAURAINE LOISE BUOY, DO  [facility] [phone] (phone) [phone] (fax)  [facility]    [1]  Outpatient Medications Prior to Visit  Medication Sig   doxycycline  (VIBRAMYCIN ) 100 MG capsule Take 1 capsule (100 mg total) by mouth 2 (two) times daily for 7 days.   gentamicin  cream (GARAMYCIN ) 0.1 % Apply to wound left foot once daily.   SODIUM CHLORIDE , EXTERNAL, (WOUND WASH SALINE) 0.9 %  SOLN Cleanse wound left foot once daily with sterile gauze.   Spacer/Aero-Holding Chambers (AEROCHAMBER MV) inhaler Use as instructed   [DISCONTINUED] donepezil  (ARICEPT  ODT) 5 MG disintegrating tablet Take 1 tablet (5 mg total) by mouth at bedtime.   [DISCONTINUED] Fluticasone -Umeclidin-Vilant (TRELEGY ELLIPTA ) 100-62.5-25 MCG/ACT AEPB Inhale 1 puff into the lungs daily.   [DISCONTINUED] ondansetron  (ZOFRAN ) 4 MG tablet Take 1 tablet (4 mg total) by mouth every 8 (eight) hours as needed for nausea or vomiting.   [DISCONTINUED] pantoprazole  (PROTONIX ) 40 MG tablet Take 1 tablet (40 mg total) by mouth daily. Take in the morning before breakfast   rOPINIRole  (REQUIP ) 0.25 MG tablet Take 1 tablet (0.25 mg total) by mouth 2 (two) times daily. (Patient not taking: Reported on 12/24/2023)   No facility-administered medications prior to visit.

## 2023-12-24 NOTE — Assessment & Plan Note (Signed)
 Requip  discontinued. - Consider restarting Requip  if symptoms recur, especially before sleep.

## 2023-12-24 NOTE — Assessment & Plan Note (Addendum)
 Chronic, managed with daily Trelegy but with inconsistent adherence.  No recent exacerbations.  - Counseled on medication adherence.   - Offered nebulizer in place of Trelelgy; patient's husband declined preferring to focus on being more stringent with patient using her Trelegy.

## 2023-12-24 NOTE — Assessment & Plan Note (Signed)
 Previous mild elevations of TSH, not currently on medication. Will recheck level today.

## 2023-12-24 NOTE — Assessment & Plan Note (Signed)
 Noted.  No acute concerns.  Continue to monitor.

## 2023-12-24 NOTE — Assessment & Plan Note (Signed)
 Worsening memory. Donepezil  not initiated due to lack of awareness. - Re-sent prescription for donepezil  to pharmacy. - Instructed to take donepezil  at bedtime with other medications.

## 2023-12-25 LAB — COMPREHENSIVE METABOLIC PANEL WITH GFR
ALT: 11 IU/L (ref 0–32)
AST: 12 IU/L (ref 0–40)
Albumin: 4.5 g/dL (ref 3.8–4.8)
Alkaline Phosphatase: 91 IU/L (ref 49–135)
BUN/Creatinine Ratio: 16 (ref 12–28)
BUN: 19 mg/dL (ref 8–27)
Bilirubin Total: 0.5 mg/dL (ref 0.0–1.2)
CO2: 23 mmol/L (ref 20–29)
Calcium: 10.4 mg/dL — ABNORMAL HIGH (ref 8.7–10.3)
Chloride: 101 mmol/L (ref 96–106)
Creatinine, Ser: 1.16 mg/dL — ABNORMAL HIGH (ref 0.57–1.00)
Globulin, Total: 3.3 g/dL (ref 1.5–4.5)
Glucose: 76 mg/dL (ref 70–99)
Potassium: 4.5 mmol/L (ref 3.5–5.2)
Sodium: 139 mmol/L (ref 134–144)
Total Protein: 7.8 g/dL (ref 6.0–8.5)
eGFR: 48 mL/min/1.73 — ABNORMAL LOW (ref >59)

## 2023-12-25 LAB — TSH RFX ON ABNORMAL TO FREE T4: TSH: 7.38 u[IU]/mL — ABNORMAL HIGH (ref 0.450–4.500)

## 2023-12-25 LAB — T4F: T4,Free (Direct): 1.22 ng/dL (ref 0.82–1.77)

## 2023-12-25 LAB — VITAMIN B12: Vitamin B-12: 555 pg/mL (ref 232–1245)

## 2023-12-28 ENCOUNTER — Encounter: Payer: Self-pay | Admitting: Podiatry

## 2023-12-28 NOTE — Progress Notes (Signed)
 "  Subjective:  Patient ID: Katie Carter, female    DOB: [birth year],  MRN: [id number]  Katie Carter presents to clinic today for painful mycotic toenails of both feet that are difficult to trim. Pain interferes with daily activities and wearing enclosed shoe gear comfortably. She is accompanied by her husband on today's visit. Patient has h/o dementia. She has been wearing tube foam on her left 2nd toe for an extended time and husband is unsure how long she has been wearing the device. She relates tenderness under the 2nd toe left foot. Chief Complaint  Patient presents with   Nail Problem     RFC. She will see Dr. Donzella in Jan 2026. She has a spot under the 2nd toe left foot that hurts. She denies being diabetic      PCP is Donzella Lauraine SAILOR, DO.  Allergies[1]  Review of Systems: Negative except as noted in the HPI.  Objective:  There were no vitals filed for this visit. BRIDGIT EYNON is a pleasant 78 y.o. female WD, WN in NAD. AAO x 3.  Vascular Examination: Capillary refill time <3 seconds b/l LE. Palpable pedal pulses b/l LE. Digital hair present b/l. No pedal edema b/l. Skin temperature gradient WNL b/l. No varicosities b/l. SABRA  Dermatological Examination: Pedal skin with normal turgor, texture and tone b/l. No open wounds.  Toenails 1-5 b/l thickened, discolored, dystrophic with subungual debris. There is pain on palpation to dorsal aspect of nailplates.   Small transverse wound sulcus area left 2nd toe.  Neurological Examination: Protective sensation intact with 10 gram monofilament b/l LE. Vibratory sensation intact b/l LE.   Musculoskeletal Examination: Normal muscle strength 5/5 to all lower extremity muscle groups bilaterally. Hammertoe(s) noted to the 2-5 bilaterally. No pain, crepitus or joint limitation noted with ROM b/l LE.  Patient ambulates independently without assistive aids.  Assessment/Plan: 1. Pain due to onychomycosis of toenails of both feet   2. Open  wound of foot, left, initial encounter     Meds ordered this encounter  Medications   doxycycline  (VIBRAMYCIN ) 100 MG capsule    Sig: Take 1 capsule (100 mg total) by mouth 2 (two) times daily for 7 days.    Dispense:  14 capsule    Refill:  0   SODIUM CHLORIDE , EXTERNAL, (WOUND WASH SALINE) 0.9 % SOLN    Sig: Cleanse wound left foot once daily with sterile gauze.    Dispense:  210 mL    Refill:  0   gentamicin  cream (GARAMYCIN ) 0.1 %    Sig: Apply to wound left foot once daily.    Dispense:  30 g    Refill:  1  -Patient was evaluated today. All questions/concerns addressed on today's visit. -Patient's family member present. All questions/concerns addressed on today's visit. -Patient to continue soft, supportive shoe gear daily. -Toenails 1-5 b/l were debrided in length and girth with sterile nail nippers and dremel without iatrogenic bleeding.  -Discussed wound with husband. Dispensed instructions for local wound care. Prescription written for Gentamicin  Cream. Patient is to apply to wound left foot once daily and cover with dressing. -Rx for Doxycyline 100 mg, #20, to be taken once daily for 10 days. -Patient to follow up in 2-3 weeks for reassessment of open wound sulcus left 2nd toe. -Patient/POA to call should there be question/concern in the interim.   Return in about 3 months (around 03/19/2024).  Katie Carter, DPM      [place] LOCATION: [address], [place] [address]                   Office [phone]   [facility] LOCATION: [address] [place], [place] [address] Office [phone]     [1]  Allergies Allergen Reactions   Meloxicam  Other (See Comments)    Severe chest pain   Aspirin      vomiting   Tramadol Rash   "

## 2024-01-16 ENCOUNTER — Ambulatory Visit: Payer: Self-pay | Admitting: Family Medicine

## 2024-01-17 ENCOUNTER — Ambulatory Visit: Admitting: Podiatry

## 2024-01-17 ENCOUNTER — Encounter: Payer: Self-pay | Admitting: Podiatry

## 2024-01-17 VITALS — Ht 64.0 in | Wt 141.9 lb

## 2024-01-17 DIAGNOSIS — S91302A Unspecified open wound, left foot, initial encounter: Secondary | ICD-10-CM | POA: Diagnosis not present

## 2024-01-17 NOTE — Progress Notes (Signed)
 "  Chief Complaint  Patient presents with   Wound Check    Pt is here due to wound on left second toe, not sure when it began, was seen by Dr Gaynel and was prescribed antibiotic for this issue, states no pain. She has dementia.    HPI: 79 y.o. female presenting for a wound to the plantar aspect of the left second toe.  Referred by Dr. May.  She was prescribed oral antibiotics.  Presenting today with her partner of 30 years  Past Medical History:  Diagnosis Date   Arthritis    Chronic airway obstruction (HCC)    Chronic kidney disease, stage 3a (HCC)    Complication of anesthesia    slow to wake   Dyspnea    Elevated blood pressure reading 06/11/2022   Follow-up exam, 3-6 months since previous exam 12/11/2022   Hypercholesteremia    Lumbago 05/06/2006   Moderate dementia without behavioral disturbance, psychotic disturbance, mood disturbance, or anxiety (HCC)    Nausea and vomiting 06/11/2022   Restless leg syndrome    Thyroid  disease 10/15/2005   Tobacco use disorder    Wears dentures    partial upper and lower    Past Surgical History:  Procedure Laterality Date   ABDOMINAL HYSTERECTOMY  1960   abdominal Hysterectomy with bilateral salpingo-oophorectomy   APPENDECTOMY     CATARACT EXTRACTION Right 10/2012   CATARACT EXTRACTION W/PHACO Left 07/05/2022   Procedure: CATARACT EXTRACTION PHACO AND INTRAOCULAR LENS PLACEMENT (IOC) LEFT 16.41 01:15.4;  Surgeon: Enola Feliciano Hugger, MD;  Location: [facility];  Service: Ophthalmology;  Laterality: Left;   Surgical Fixation  1981   of the left knee fracture   TOTAL KNEE ARTHROPLASTY Left 11/20/2017   Procedure: TOTAL KNEE ARTHROPLASTY hardware removal;  Surgeon: Leora Lynwood SAUNDERS, MD;  Location: [facility];  Service: Orthopedics;  Laterality: Left;    Allergies[1]   Physical Exam: General: The patient is alert and oriented x3 in no acute distress.  Dermatology: Skin is warm, dry and supple bilateral lower  extremities.  No open wounds noted today.  There is some callus tissue noted from a freshly healed wound to the plantar aspect of the left second toe.  After debridement of the loosely adhered callus the wound is completely healed and resolved  Vascular: Palpable pedal pulses bilaterally. Capillary refill within normal limits.  No appreciable edema.  No erythema.  Neurological: Grossly intact via light touch  Musculoskeletal Exam: No pedal deformities noted   Assessment/Plan of Care: 1.  Wound plantar aspect of the left second toe; healed  - Light debridement of the callus tissue was performed today using a tissue nipper without incident or bleeding. -Continue good supportive tennis shoes and sneakers -Return to clinic next scheduled appointment with Dr. May for routine footcare     Thresa EMERSON Sar, DPM [facility]  Dr. Thresa EMERSON Sar, DPM    [address] [place], [place] [address]                Office [phone]  Fax [phone]        [1]  Allergies Allergen Reactions   Meloxicam  Other (See Comments)    Severe chest pain   Aspirin   vomiting   Tramadol Rash   "

## 2024-03-20 ENCOUNTER — Ambulatory Visit: Admitting: Podiatry

## 2024-06-23 ENCOUNTER — Encounter
